# Patient Record
Sex: Male | Born: 1965 | Race: Black or African American | Hispanic: No | Marital: Married | Smoking: Current some day smoker
Health system: Southern US, Community
[De-identification: ages and names within clinical notes are randomized; demographics above are authoritative.]

## PROBLEM LIST (undated history)

## (undated) DIAGNOSIS — I10 Essential (primary) hypertension: Secondary | ICD-10-CM

## (undated) DIAGNOSIS — K219 Gastro-esophageal reflux disease without esophagitis: Secondary | ICD-10-CM

## (undated) HISTORY — DX: Gastro-esophageal reflux disease without esophagitis: K21.9

---

## 2007-10-06 ENCOUNTER — Other Ambulatory Visit: Payer: Self-pay

## 2007-10-06 ENCOUNTER — Emergency Department: Payer: Self-pay | Admitting: Emergency Medicine

## 2007-10-28 ENCOUNTER — Ambulatory Visit: Payer: Self-pay | Admitting: Specialist

## 2008-10-02 IMAGING — CT CT ABDOMEN AND PELVIS WITHOUT AND WITH CONTRAST
2 of 4 series · 14 of 32 positions shown, 19 images · non-contrast
Comparison: none

REASON FOR EXAM: hematuria flank pain polynephritis
COMMENTS:

[Series 2: soft tissue w/o · axial · non-contrast · 0.67mm/px · z∈[-733,-363]mm · 8 of 96 slices shown, 13 images]
[im 11/96  soft-tissue]
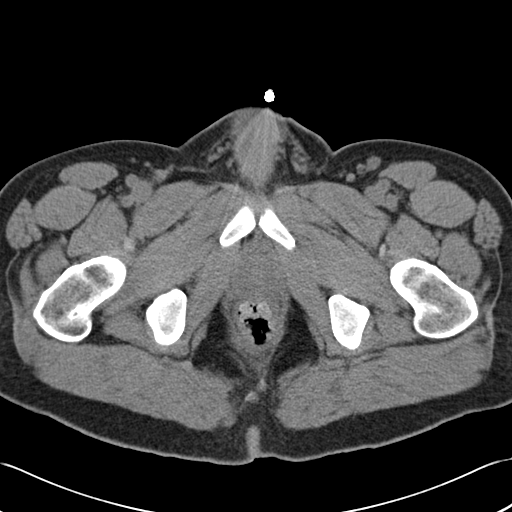
[im 11/96  bone]
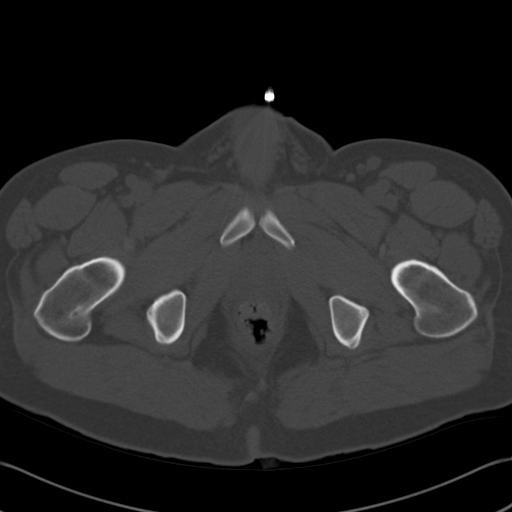
[im 22/96  soft-tissue]
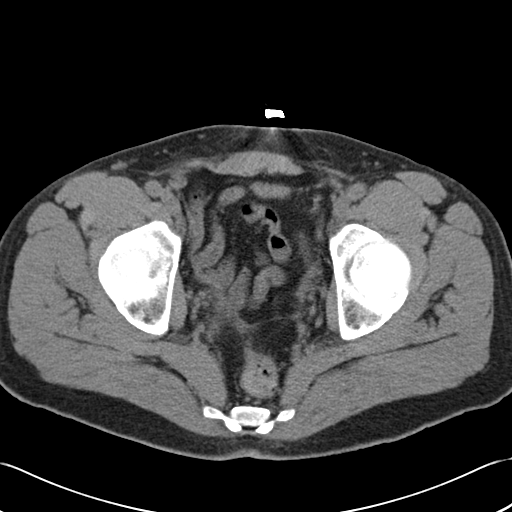
[im 32/96  soft-tissue]
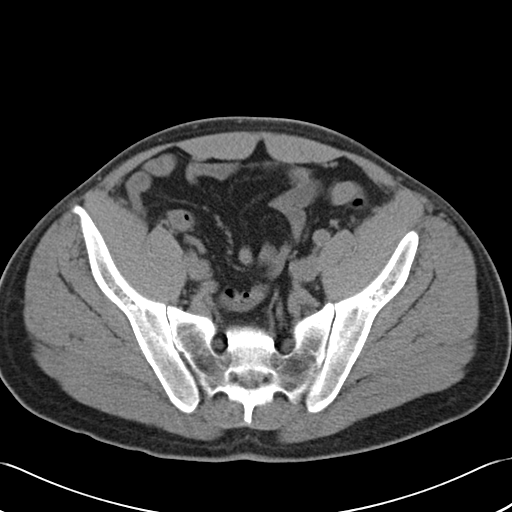
[im 43/96  soft-tissue]
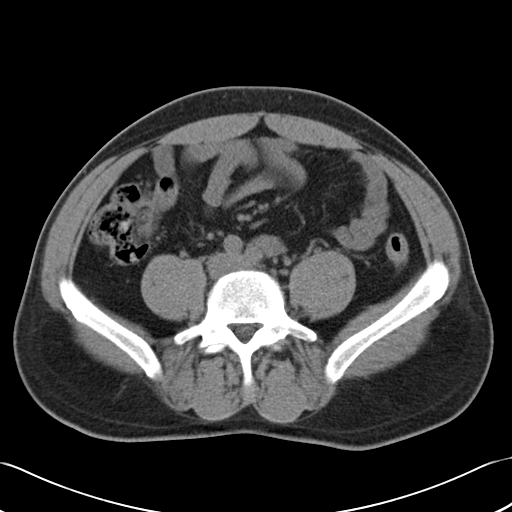
[im 53/96  soft-tissue]
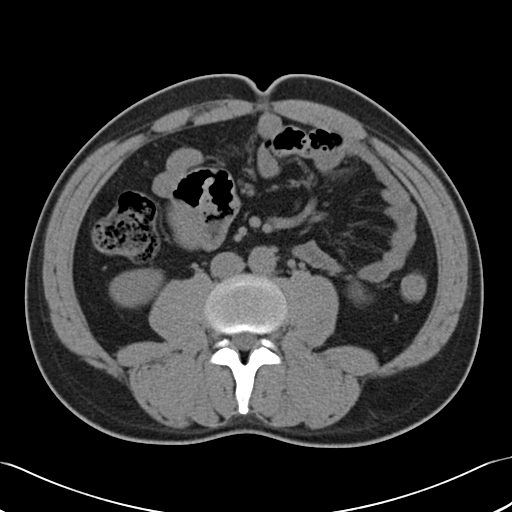
[im 53/96  lung]
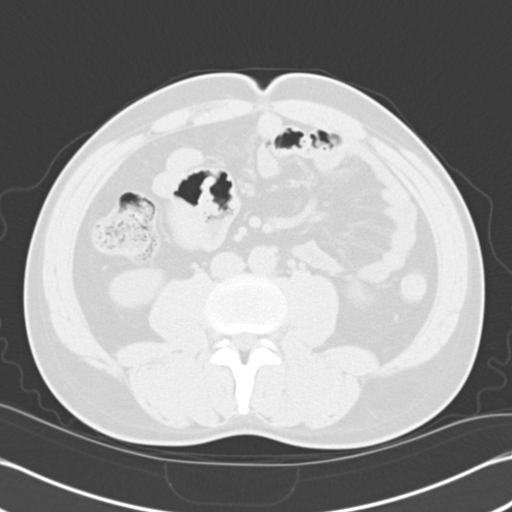
[im 64/96  soft-tissue]
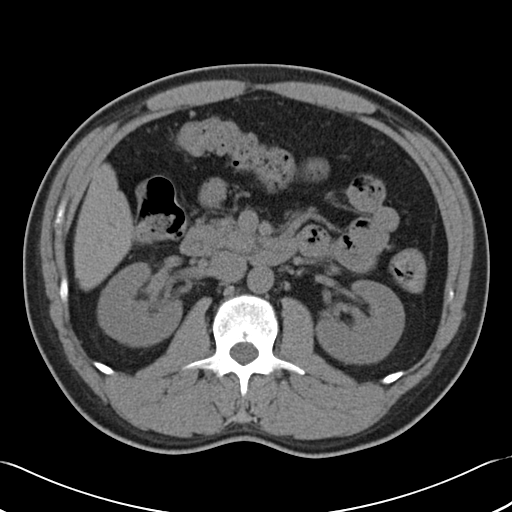
[im 64/96  lung]
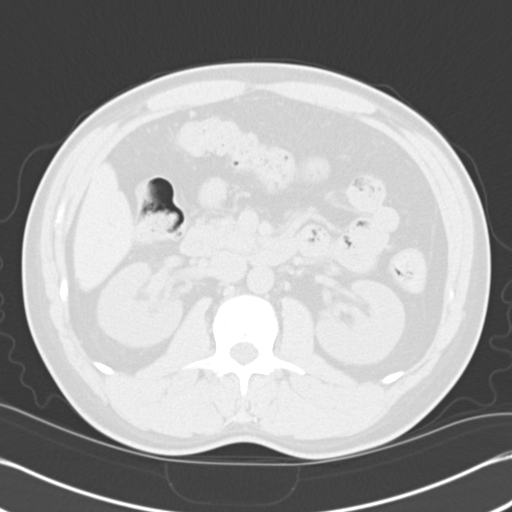
[im 74/96  soft-tissue]
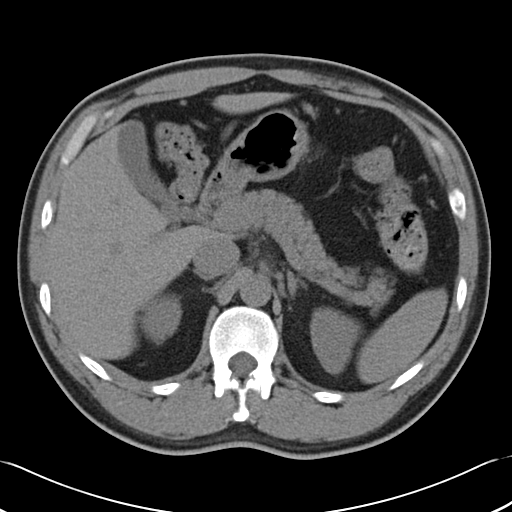
[im 74/96  lung]
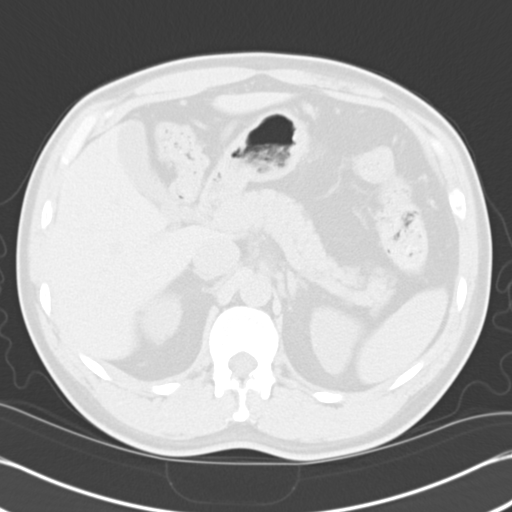
[im 85/96  soft-tissue]
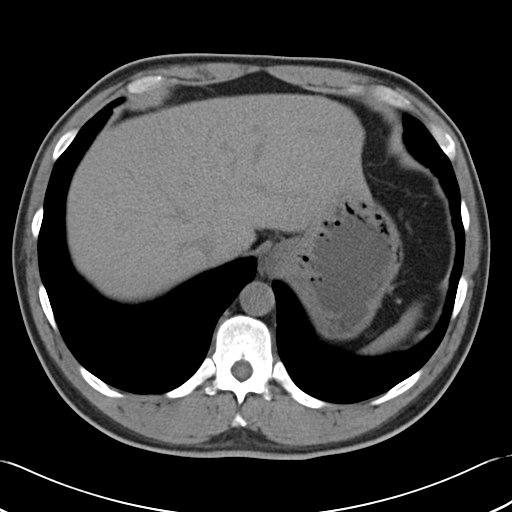
[im 85/96  lung]
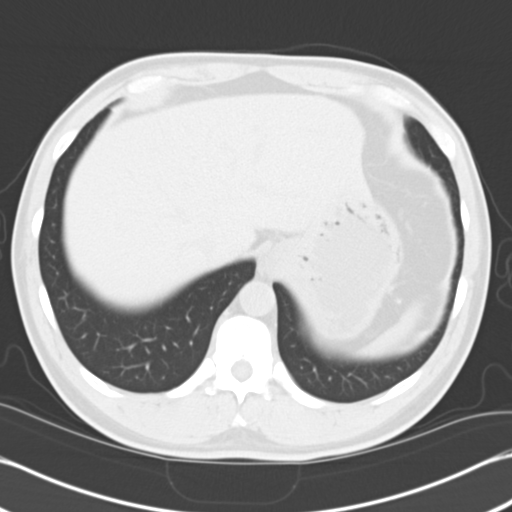

[Series 4: soft tissue with · axial · 0.67mm/px · z∈[-733,-468]mm · 6 of 96 slices shown]
[im 11/96  soft-tissue]
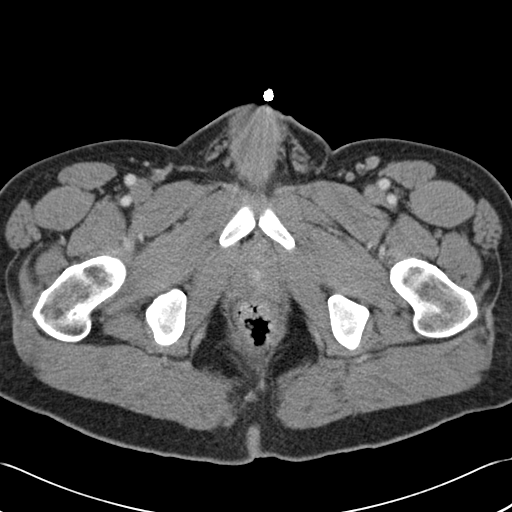
[im 22/96  soft-tissue]
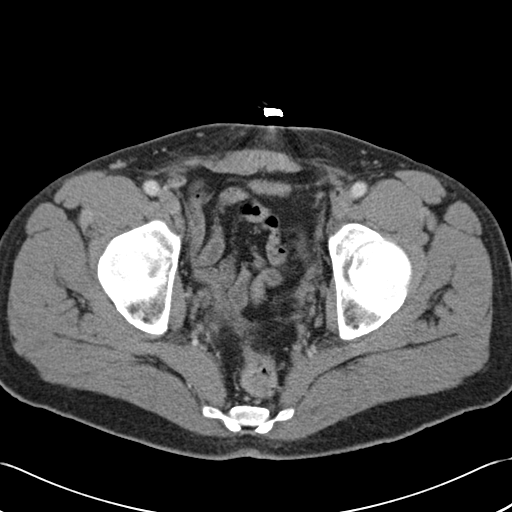
[im 32/96  soft-tissue]
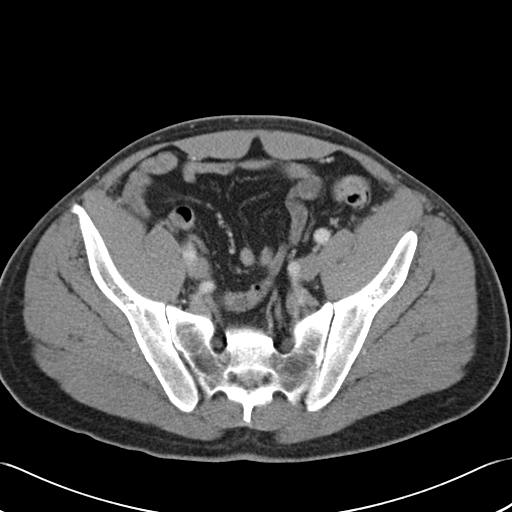
[im 43/96  soft-tissue]
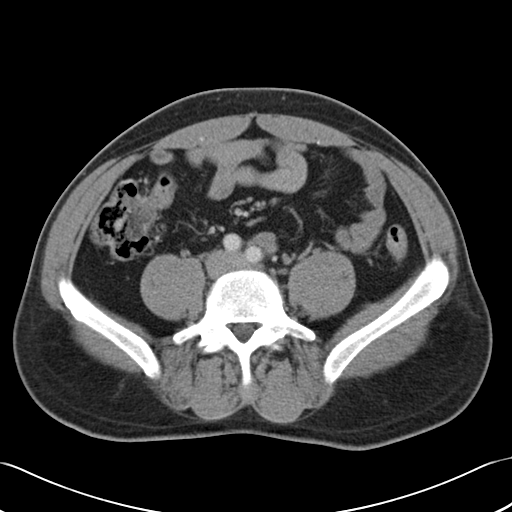
[im 53/96  soft-tissue]
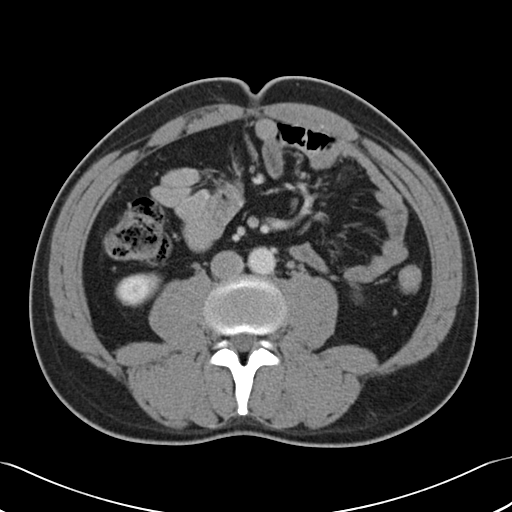
[im 64/96  soft-tissue]
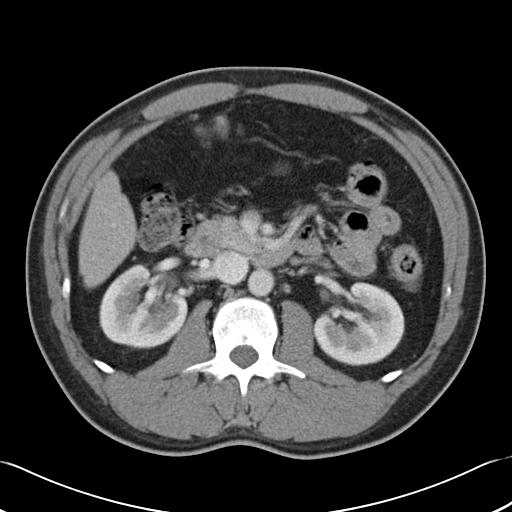

[14 of 32 positions shown; findings below may reference images not displayed]

PROCEDURE:     CT  - CT ABDOMEN / PELVIS  W/WO  - October 28, 2007  [DATE]

RESULT:     Helical pre, immediate and delayed 5 mm sections were obtained
from the lung bases through the pubic symphysis.  This study was compared to
a previous study dated 10/06/07.

Evaluation of the lung bases demonstrates no gross abnormalities.

The area of increased density within the medial aspect of the RIGHT kidney
in a parapelvic location is not clearly identified on the present study. A
small 8-9 mm low attenuating area projects within the posterolateral aspect
of the RIGHT kidney likely representing a small cyst.  No further masses,
hydronephrosis, calculi, hydroureter or ureterolithiasis is appreciated on
the RIGHT.  Evaluation of the LEFT kidney demonstrates no evidence of
hydronephrosis, masses, calculi, hydroureter, nephrolithiasis or
ureterolithiasis.

The liver, spleen, adrenals, and pancreas are unremarkable.  There is no CT
evidence of bowel obstruction, diverticulitis, colitis, appendicitis or an
abdominal aortic aneurysm.  There is no evidence of free fluid or drainable
loculated fluid collections, masses or adenopathy.
IMPRESSION: 1.     Unremarkable CT of the abdomen and pelvis.
2.     Incidental note is made of what appears to be likely a small cyst
within the posterior midpole region of the RIGHT kidney. No further
abnormalities are identified.  There does not appear to be a definite
correlation with the previous finding on CT from 10/06/07.

## 2012-06-13 ENCOUNTER — Emergency Department: Payer: Self-pay | Admitting: Emergency Medicine

## 2012-06-13 LAB — COMPREHENSIVE METABOLIC PANEL
Bilirubin,Total: 1.3 mg/dL — ABNORMAL HIGH (ref 0.2–1.0)
Calcium, Total: 9.1 mg/dL (ref 8.5–10.1)
Chloride: 108 mmol/L — ABNORMAL HIGH (ref 98–107)
Creatinine: 1.03 mg/dL (ref 0.60–1.30)
EGFR (African American): >60
EGFR (Non-African Amer.): >60
Glucose: 91 mg/dL (ref 65–99)
Osmolality: 279 (ref 275–301)
Total Protein: 8.1 g/dL (ref 6.4–8.2)

## 2012-06-13 LAB — URINALYSIS, COMPLETE
Bacteria: NONE SEEN
Bilirubin,UR: NEGATIVE
Blood: NEGATIVE
Glucose,UR: NEGATIVE mg/dL (ref 0–75)
Ketone: NEGATIVE
Leukocyte Esterase: NEGATIVE
Nitrite: NEGATIVE
Ph: 6 (ref 4.5–8.0)
Protein: NEGATIVE
RBC,UR: 1 /HPF (ref 0–5)
Specific Gravity: 1.021 (ref 1.003–1.030)
Squamous Epithelial: <1
WBC UR: <1 /HPF (ref 0–5)

## 2012-06-13 LAB — CBC WITH DIFFERENTIAL/PLATELET
Basophil #: 0 10*3/uL (ref 0.0–0.1)
Basophil %: 0.9 %
Eosinophil #: 0 10*3/uL (ref 0.0–0.7)
Eosinophil %: 1.3 %
Lymphocyte #: 1.4 10*3/uL (ref 1.0–3.6)
Lymphocyte %: 36.2 %
MCH: 28.9 pg (ref 26.0–34.0)
MCV: 86 fL (ref 80–100)
RBC: 5.5 10*6/uL (ref 4.40–5.90)
RDW: 14.1 % (ref 11.5–14.5)
WBC: 3.7 10*3/uL — ABNORMAL LOW (ref 3.8–10.6)

## 2014-07-20 ENCOUNTER — Emergency Department
Admit: 2014-07-20 | Disposition: A | Discharge status: Home / Self Care | Payer: Self-pay | Admitting: Emergency Medicine

## 2014-07-20 LAB — CBC WITH DIFFERENTIAL/PLATELET
Basophil #: 0 10*3/uL (ref 0.0–0.1)
Basophil %: 0.6 %
Eosinophil #: 0 10*3/uL (ref 0.0–0.7)
Eosinophil %: 0.4 %
HCT: 46 % (ref 40.0–52.0)
HGB: 15.1 g/dL (ref 13.0–18.0)
LYMPHS PCT: 14.8 %
Lymphocyte #: 1.1 10*3/uL (ref 1.0–3.6)
MCH: 28.9 pg (ref 26.0–34.0)
MCHC: 32.9 g/dL (ref 32.0–36.0)
MCV: 88 fL (ref 80–100)
Monocyte #: 0.4 x10 3/mm (ref 0.2–1.0)
Monocyte %: 5.6 %
NEUTROS PCT: 78.6 %
Neutrophil #: 5.9 10*3/uL (ref 1.4–6.5)
PLATELETS: 180 10*3/uL (ref 150–440)
RBC: 5.25 10*6/uL (ref 4.40–5.90)
RDW: 14.1 % (ref 11.5–14.5)
WBC: 7.5 10*3/uL (ref 3.8–10.6)

## 2014-07-20 LAB — BASIC METABOLIC PANEL
Anion Gap: 4 — ABNORMAL LOW (ref 7–16)
BUN: 19 mg/dL
CHLORIDE: 107 mmol/L
Calcium, Total: 8.9 mg/dL
Co2: 24 mmol/L
Creatinine: 1.21 mg/dL
EGFR (African American): >60
EGFR (Non-African Amer.): >60
GLUCOSE: 97 mg/dL
Potassium: 4 mmol/L
Sodium: 135 mmol/L

## 2014-07-20 LAB — TROPONIN I: Troponin-I: <0.03 ng/mL

## 2015-03-21 ENCOUNTER — Emergency Department
Admission: EM | Admit: 2015-03-21 | Discharge: 2015-03-21 | Disposition: A | Discharge status: Home / Self Care | Payer: Self-pay | Attending: Student | Admitting: Student

## 2015-03-21 ENCOUNTER — Encounter: Payer: Self-pay | Admitting: Emergency Medicine

## 2015-03-21 DIAGNOSIS — I10 Essential (primary) hypertension: Secondary | ICD-10-CM | POA: Insufficient documentation

## 2015-03-21 DIAGNOSIS — Z760 Encounter for issue of repeat prescription: Secondary | ICD-10-CM | POA: Insufficient documentation

## 2015-03-21 DIAGNOSIS — F17210 Nicotine dependence, cigarettes, uncomplicated: Secondary | ICD-10-CM | POA: Insufficient documentation

## 2015-03-21 HISTORY — DX: Essential (primary) hypertension: I10

## 2015-03-21 MED ORDER — LISINOPRIL 20 MG PO TABS: 20.0000 mg | ORAL_TABLET | Freq: Every day | ORAL | Status: DC

## 2015-03-21 NOTE — Discharge Instructions (Signed)
Hypertension Hypertension, commonly called high blood pressure, is when the force of blood pumping through your arteries is too strong. Your arteries are the blood vessels that carry blood from your heart throughout your body. A blood pressure reading consists of a higher number over a lower number, such as 110/72. The higher number (systolic) is the pressure inside your arteries when your heart pumps. The lower number (diastolic) is the pressure inside your arteries when your heart relaxes. Ideally you want your blood pressure below 120/80. Hypertension forces your heart to work harder to pump blood. Your arteries may become narrow or stiff. Having untreated or uncontrolled hypertension can cause heart attack, stroke, kidney disease, and other problems. RISK FACTORS Some risk factors for high blood pressure are controllable. Others are not.  Risk factors you cannot control include:   Race. You may be at higher risk if you are African American.  Age. Risk increases with age.  Gender. Men are at higher risk than women before age 45 years. After age 65, women are at higher risk than men. Risk factors you can control include:  Not getting enough exercise or physical activity.  Being overweight.  Getting too much fat, sugar, calories, or salt in your diet.  Drinking too much alcohol. SIGNS AND SYMPTOMS Hypertension does not usually cause signs or symptoms. Extremely high blood pressure (hypertensive crisis) may cause headache, anxiety, shortness of breath, and nosebleed. DIAGNOSIS To check if you have hypertension, your health care provider will measure your blood pressure while you are seated, with your arm held at the level of your heart. It should be measured at least twice using the same arm. Certain conditions can cause a difference in blood pressure between your right and left arms. A blood pressure reading that is higher than normal on one occasion does not mean that you need treatment. If  it is not clear whether you have high blood pressure, you may be asked to return on a different day to have your blood pressure checked again. Or, you may be asked to monitor your blood pressure at home for 1 or more weeks. TREATMENT Treating high blood pressure includes making lifestyle changes and possibly taking medicine. Living a healthy lifestyle can help lower high blood pressure. You may need to change some of your habits. Lifestyle changes may include:  Following the DASH diet. This diet is high in fruits, vegetables, and whole grains. It is low in salt, red meat, and added sugars.  Keep your sodium intake below 2,300 mg per day.  Getting at least 30-45 minutes of aerobic exercise at least 4 times per week.  Losing weight if necessary.  Not smoking.  Limiting alcoholic beverages.  Learning ways to reduce stress. Your health care provider may prescribe medicine if lifestyle changes are not enough to get your blood pressure under control, and if one of the following is true:  You are 18-59 years of age and your systolic blood pressure is above 140.  You are 60 years of age or older, and your systolic blood pressure is above 150.  Your diastolic blood pressure is above 90.  You have diabetes, and your systolic blood pressure is over 140 or your diastolic blood pressure is over 90.  You have kidney disease and your blood pressure is above 140/90.  You have heart disease and your blood pressure is above 140/90. Your personal target blood pressure may vary depending on your medical conditions, your age, and other factors. HOME CARE INSTRUCTIONS    Have your blood pressure rechecked as directed by your health care provider.   Take medicines only as directed by your health care provider. Follow the directions carefully. Blood pressure medicines must be taken as prescribed. The medicine does not work as well when you skip doses. Skipping doses also puts you at risk for  problems.  Do not smoke.   Monitor your blood pressure at home as directed by your health care provider. SEEK MEDICAL CARE IF:   You think you are having a reaction to medicines taken.  You have recurrent headaches or feel dizzy.  You have swelling in your ankles.  You have trouble with your vision. SEEK IMMEDIATE MEDICAL CARE IF:  You develop a severe headache or confusion.  You have unusual weakness, numbness, or feel faint.  You have severe chest or abdominal pain.  You vomit repeatedly.  You have trouble breathing. MAKE SURE YOU:   Understand these instructions.  Will watch your condition.  Will get help right away if you are not doing well or get worse.   This information is not intended to replace advice given to you by your health care provider. Make sure you discuss any questions you have with your health care provider.   Document Released: 03/20/2005 Document Revised: 08/04/2014 Document Reviewed: 01/10/2013 Elsevier Interactive Patient Education 2016 [employer]  

## 2015-03-21 NOTE — ED Provider Notes (Signed)
[facility] Emergency Department Provider Note ____________________________________________  Time seen: Approximately 8:11 AM  I have reviewed the triage vital signs and the nursing notes.   HISTORY  Chief Complaint Medication Refill  HPI Timothy Combs is a 49 y.o. male who presents to the emergency department for medication refill. He denies complaints today. He denies medical history other than hypertension. He has been out of the lisinopril for about 3 months. He received his last Rx here in the emergency department.   Past Medical History  Diagnosis Date  . Hypertension     There are no active problems to display for this patient.   No past surgical history on file.  Current Outpatient Rx  Name  Route  Sig  Dispense  Refill  . lisinopril (PRINIVIL,ZESTRIL) 20 MG tablet   Oral   Take 1 tablet (20 mg total) by mouth daily.   30 tablet   0     Allergies Review of patient's allergies indicates no known allergies.  No family history on file.  Social History Social History  Substance Use Topics  . Smoking status: Light Tobacco Smoker    Types: Cigars  . Smokeless tobacco: Not on file  . Alcohol Use: Not on file    Review of Systems Constitutional: No fever/chills Eyes: No visual changes. ENT: No sore throat. Cardiovascular: Denies chest pain. Respiratory: Denies shortness of breath. Gastrointestinal: No abdominal pain.  No nausea, no vomiting.  No diarrhea.  No constipation. Genitourinary: Negative for dysuria. Musculoskeletal: Negative for back pain. Skin: Negative for rash. Neurological: Negative for headaches, focal weakness or numbness.  10-point ROS otherwise negative.  ____________________________________________   PHYSICAL EXAM:  VITAL SIGNS: ED Triage Vitals  Enc Vitals Group     BP 03/21/15 0754 175/127 mmHg     Pulse Rate 03/21/15 0754 74     Resp 03/21/15 0754 20     Temp 03/21/15 0754 98.4 F (36.9 C)      Temp Source 03/21/15 0754 Oral     SpO2 03/21/15 0754 95 %     Weight 03/21/15 0754 205 lb (92.987 kg)     Height 03/21/15 0754  (1.905 m)     Head Cir --      Peak Flow --      Pain Score --      Pain Loc --      Pain Edu? --      Excl. in GC? --     Constitutional: Alert and oriented. Well appearing and in no acute distress. Eyes: Conjunctivae are normal. PERRL. EOMI. Head: Atraumatic. Nose: No congestion/rhinnorhea. Mouth/Throat: Mucous membranes are moist.  Oropharynx non-erythematous. Neck: No stridor.   Cardiovascular: Normal rate, regular rhythm. Grossly normal heart sounds.  Good peripheral circulation. Respiratory: Normal respiratory effort.  No retractions. Lungs CTAB. Gastrointestinal: Soft and nontender. No distention. No abdominal bruits. No CVA tenderness. Musculoskeletal: No lower extremity tenderness nor edema.  No joint effusions. Neurologic:  Normal speech and language. No gross focal neurologic deficits are appreciated. No gait instability. Skin:  Skin is warm, dry and intact. No rash noted. Psychiatric: Mood and affect are normal. Speech and behavior are normal.  ____________________________________________   LABS (all labs ordered are listed, but only abnormal results are displayed)  Labs Reviewed - No data to display ____________________________________________  EKG   ____________________________________________  RADIOLOGY   ____________________________________________   PROCEDURES  Procedure(s) performed: None  Critical Care performed: No  ____________________________________________   INITIAL IMPRESSION /  ASSESSMENT AND PLAN / ED COURSE  Pertinent labs & imaging results that were available during my care of the patient were reviewed by me and considered in my medical decision making (see chart for details).  Patient was given information about medication management and [facility]. He was given strict return precautions  and strongly advised to follow up. ____________________________________________   FINAL CLINICAL IMPRESSION(S) / ED DIAGNOSES  Final diagnoses:  Medication refill  Essential hypertension      Chinita PesterCari B Enna Warwick, FNP 03/21/15 [id number]  Gayla DossEryka A Gayle, MD 03/21/15 [phone]

## 2015-03-21 NOTE — ED Notes (Signed)
Pt out of lisinopril x 3 months; his friend was coming in to get a refill, so he decided to come with his friend to have his medication refilled as well. Pt alert & oriented. Denies HA, blurred vision, pain. NAD noted.

## 2015-03-21 NOTE — ED Notes (Signed)
Pt discharged home after verbalizing understanding of discharge instructions; nad noted. Pt encouraged to call [facility] immediately to schedule follow up and to see medication management for prescription help.

## 2015-03-21 NOTE — ED Notes (Signed)
Patient presents to the ED with an empty pill bottle for lisinopril 20mg .  Patient states he ran out of medication and hasn't had any for 3 months due to no insurance and no primary doctor.  This RN educated patient regarding medication management clinic.  Patient denies headache and blurry vision.  Patient is in no obvious distress at this time.

## 2017-01-29 DIAGNOSIS — I10 Essential (primary) hypertension: Secondary | ICD-10-CM | POA: Insufficient documentation

## 2017-01-29 DIAGNOSIS — R309 Painful micturition, unspecified: Secondary | ICD-10-CM | POA: Insufficient documentation

## 2017-01-29 DIAGNOSIS — I77819 Aortic ectasia, unspecified site: Secondary | ICD-10-CM | POA: Insufficient documentation

## 2017-01-29 DIAGNOSIS — F17290 Nicotine dependence, other tobacco product, uncomplicated: Secondary | ICD-10-CM | POA: Insufficient documentation

## 2017-01-29 DIAGNOSIS — N390 Urinary tract infection, site not specified: Secondary | ICD-10-CM | POA: Insufficient documentation

## 2017-01-29 LAB — URINALYSIS, COMPLETE (UACMP) WITH MICROSCOPIC
SPECIFIC GRAVITY, URINE: 1.012 (ref 1.005–1.030)
Squamous Epithelial / LPF: NONE SEEN

## 2017-01-29 LAB — COMPREHENSIVE METABOLIC PANEL
ALK PHOS: 40 U/L (ref 38–126)
ALT: 28 U/L (ref 17–63)
AST: 20 U/L (ref 15–41)
Albumin: 4.2 g/dL (ref 3.5–5.0)
Anion gap: 10 (ref 5–15)
BILIRUBIN TOTAL: 1.5 mg/dL — AB (ref 0.3–1.2)
BUN: 16 mg/dL (ref 6–20)
CALCIUM: 9.3 mg/dL (ref 8.9–10.3)
CO2: 23 mmol/L (ref 22–32)
Chloride: 101 mmol/L (ref 101–111)
Creatinine, Ser: 1.17 mg/dL (ref 0.61–1.24)
GFR calc Af Amer: >60 mL/min (ref >60)
Glucose, Bld: 95 mg/dL (ref 65–99)
POTASSIUM: 3.8 mmol/L (ref 3.5–5.1)
Sodium: 134 mmol/L — ABNORMAL LOW (ref 135–145)
TOTAL PROTEIN: 8 g/dL (ref 6.5–8.1)

## 2017-01-29 LAB — CBC
HEMATOCRIT: 46.1 % (ref 40.0–52.0)
Hemoglobin: 15.7 g/dL (ref 13.0–18.0)
MCH: 28.8 pg (ref 26.0–34.0)
MCHC: 34.1 g/dL (ref 32.0–36.0)
MCV: 84.5 fL (ref 80.0–100.0)
PLATELETS: 212 10*3/uL (ref 150–440)
RBC: 5.46 MIL/uL (ref 4.40–5.90)
RDW: 13.7 % (ref 11.5–14.5)
WBC: 6.1 10*3/uL (ref 3.8–10.6)

## 2017-01-29 LAB — LIPASE, BLOOD: LIPASE: 27 U/L (ref 11–51)

## 2017-01-29 NOTE — ED Triage Notes (Addendum)
Reports mid/suprapubic abdominal pain for 2 days.  Patient denies nausea or vomiting.  Patient with history of hypertension, but reports he does not take medications as prescribed.

## 2017-01-30 ENCOUNTER — Emergency Department: Payer: Self-pay

## 2017-01-30 ENCOUNTER — Emergency Department
Admission: EM | Admit: 2017-01-30 | Discharge: 2017-01-30 | Disposition: A | Discharge status: Home / Self Care | Payer: Self-pay | Attending: Emergency Medicine | Admitting: Emergency Medicine

## 2017-01-30 DIAGNOSIS — I77819 Aortic ectasia, unspecified site: Secondary | ICD-10-CM

## 2017-01-30 DIAGNOSIS — R309 Painful micturition, unspecified: Secondary | ICD-10-CM

## 2017-01-30 DIAGNOSIS — N390 Urinary tract infection, site not specified: Secondary | ICD-10-CM

## 2017-01-30 MED ORDER — LISINOPRIL 10 MG PO TABS
10.0000 mg | ORAL_TABLET | Freq: Once | ORAL | Status: AC
  Administered 2017-01-30: 10 mg via ORAL
  Filled 2017-01-30: qty 1

## 2017-01-30 MED ORDER — CEPHALEXIN 500 MG PO CAPS
500.0000 mg | ORAL_CAPSULE | Freq: Once | ORAL | Status: AC
Start: 2017-01-30 — End: 2017-01-30
  Administered 2017-01-30: 500 mg via ORAL
  Filled 2017-01-30: qty 1

## 2017-01-30 MED ORDER — CEPHALEXIN 500 MG PO CAPS: 500.0000 mg | ORAL_CAPSULE | Freq: Four times a day (QID) | ORAL | 0 refills | Status: DC

## 2017-01-30 NOTE — Discharge Instructions (Signed)

## 2017-01-30 NOTE — ED Notes (Signed)
Pt back from ultrasound.

## 2017-01-30 NOTE — ED Provider Notes (Signed)
[facility] Emergency Department Provider Note ____________________________________________   First MD Initiated Contact with Patient 01/30/17 [phone]     (approximate)  I have reviewed the triage vital signs and the nursing notes.   HISTORY  Chief Complaint Abdominal Pain    HPI Timothy Combs is a 51 y.o. male history of hypertension  The patient presents today, reports he has been experiencing pain with urination for the last 2-3 days.  No nausea vomiting.  He denies abdominal pain to me, reported to triage that he was having mid to upper abdominal pain, but he reports the only time he is experiencing pain is when he goes to urinate and the pain is over his bladder area.  Cloudy he thinks he may have a "urinary infection".  No fevers or chills.  Sexually active with only 1 partner, has not seen any discharge or drainage from the tip of the penis.  No back pain.  No numbness or tingling.  No weakness.  No chest pain or trouble breathing.  Reports he supposed to take lisinopril 10 mg, but has not taken a few days as he is not very good about taking his medications.   Past Medical History:  Diagnosis Date  . Hypertension     There are no active problems to display for this patient.   No past surgical history on file.  Prior to Admission medications   Medication Sig Start Date End Date Taking? Authorizing Provider  cephALEXin (KEFLEX) 500 MG capsule Take 1 capsule (500 mg total) by mouth 4 (four) times daily. 01/30/17   Sharyn Creamer, MD  lisinopril (PRINIVIL,ZESTRIL) 20 MG tablet Take 1 tablet (20 mg total) by mouth daily. 03/21/15 03/20/16  Chinita Pester, FNP    Allergies Patient has no known allergies.  No family history on file.  Social History Social History  Substance Use Topics  . Smoking status: Light Tobacco Smoker    Types: Cigars  . Smokeless tobacco: Not on file  . Alcohol use Not on file    Review of  Systems Constitutional: No fever/chills Eyes: No visual changes. ENT: No sore throat. Cardiovascular: Denies chest pain. Respiratory: Denies shortness of breath. Gastrointestinal: No abdominal pain.  No nausea, no vomiting.  No diarrhea.  No constipation. Genitourinary: See HPI Musculoskeletal: Negative for back pain. Skin: Negative for rash. Neurological: Negative for headaches, focal weakness or numbness.    ____________________________________________   PHYSICAL EXAM:  VITAL SIGNS: ED Triage Vitals  Enc Vitals Group     BP 01/29/17 2238 (!) 176/136     Pulse Rate 01/29/17 2238 89     Resp 01/29/17 2238 20     Temp 01/29/17 2238 98.9 F (37.2 C)     Temp Source 01/29/17 2238 Oral     SpO2 01/30/17 0119 97 %     Weight 01/29/17 2236 235 lb (106.6 kg)     Height 01/29/17 2236 6\' 3"  (1.905 m)     Head Circumference --      Peak Flow --      Pain Score 01/29/17 2235 10     Pain Loc --      Pain Edu? --      Excl. in GC? --     Constitutional: Alert and oriented. Well appearing and in no acute distress. Eyes: Conjunctivae are normal. Head: Atraumatic. Nose: No congestion/rhinnorhea. Mouth/Throat: Mucous membranes are moist. Neck: No stridor.   Cardiovascular: Normal rate, regular rhythm. Grossly normal heart sounds.  Good peripheral circulation. Respiratory: Normal respiratory effort.  No retractions. Lungs CTAB. Gastrointestinal: Soft and nontender. No distention.  No abdominal pain in any quadrant to palpation.  No rebound or guarding.  No pain at McBurney's point.  Negative Murphy. Musculoskeletal: No lower extremity tenderness nor edema. Neurologic:  Normal speech and language. No gross focal neurologic deficits are appreciated.  Skin:  Skin is warm, dry and intact. No rash noted. Psychiatric: Mood and affect are normal. Speech and behavior are normal.  ____________________________________________   LABS (all labs ordered are listed, but only abnormal results  are displayed)  Labs Reviewed  COMPREHENSIVE METABOLIC PANEL - Abnormal; Notable for the following:       Result Value   Sodium 134 (*)    Total Bilirubin 1.5 (*)    All other components within normal limits  URINALYSIS, COMPLETE (UACMP) WITH MICROSCOPIC - Abnormal; Notable for the following:    APPearance CLOUDY (*)    Glucose, UA   (*)    Value: TEST NOT REPORTED DUE TO COLOR INTERFERENCE OF URINE PIGMENT   Hgb urine dipstick   (*)    Value: TEST NOT REPORTED DUE TO COLOR INTERFERENCE OF URINE PIGMENT   Bilirubin Urine   (*)    Value: TEST NOT REPORTED DUE TO COLOR INTERFERENCE OF URINE PIGMENT   Ketones, ur   (*)    Value: TEST NOT REPORTED DUE TO COLOR INTERFERENCE OF URINE PIGMENT   Protein, ur   (*)    Value: TEST NOT REPORTED DUE TO COLOR INTERFERENCE OF URINE PIGMENT   Nitrite   (*)    Value: TEST NOT REPORTED DUE TO COLOR INTERFERENCE OF URINE PIGMENT   Leukocytes, UA   (*)    Value: TEST NOT REPORTED DUE TO COLOR INTERFERENCE OF URINE PIGMENT   Bacteria, UA RARE (*)    All other components within normal limits  LIPASE, BLOOD  CBC   ____________________________________________  EKG   ____________________________________________  RADIOLOGY  [place] Retroperitoneal Comp  Result Date: 01/30/2017 CLINICAL DATA:  Painful urination.  Suprapubic pain. EXAM: ULTRASOUND RETROPERITONEAL COMPLETE TECHNIQUE: Ultrasound examination of the abdominal aorta was performed to evaluate for abdominal aortic aneurysm. The common iliac arteries, IVC, and kidneys were also evaluated. COMPARISON:  Remote CT 10/28/2007 FINDINGS: Abdominal Aorta No aneurysm identified. Maximum AP Diameter:  2.8 cm. Maximum TRV Diameter: 2.5 cm. Right Common Iliac Artery No aneurysm identified. Left Common Iliac Artery No aneurysm identified. IVC No abnormality visualized. Right Kidney Length: 12.6 cm. Borderline increased echogenicity and renal parenchymal thinning. 1 cm cyst in the mid kidney. No solid mass or  hydronephrosis visualized. Left Kidney Length: 13.3 cm. Borderline increased renal echogenicity. No mass or hydronephrosis visualized. Bladder: Both ureteral jets are visualized. No urinary bladder wall thickening. IMPRESSION: 1. No explanation for painful urination. 2. Borderline increased renal echogenicity and mild renal parenchymal thinning of the right kidney, possible chronic medical renal disease. 3. Ectatic abdominal aorta at risk for aneurysm development, maximal dimension 2.8 cm. Recommend followup by ultrasound in 5 years. This recommendation follows ACR consensus guidelines: White Paper of the ACR Incidental Findings Committee II on Vascular Findings. J Am Coll Radiol 2013; 16:109-604. 4. Small right renal cyst. Electronically Signed   By: Rubye Oaks M.D.   On: 01/30/2017 03:38    Ultrasound result reviewed, ectatic aorta, borderline paenchymal thinning, ____________________________________________   PROCEDURES  Procedure(s) performed: None  Procedures  Critical Care performed: No  ____________________________________________   INITIAL IMPRESSION / ASSESSMENT AND PLAN /  ED COURSE  Pertinent labs & imaging results that were available during my care of the patient were reviewed by me and considered in my medical decision making (see chart for details).  Patient presents for dysuria.  Urine is noted to be cloudy with red and white cells and rare bacteria.  Suspect possibly the patient does have a urinary tract infection given the associated dysuria, and he may have hemorrhagic cystitis given the notable number of red cells and interference.  He denies any abdominal pain, and does not appear to be at high risk for aortic etiology and I will screen with an ultrasound.  Also given the amount of red cells in the urine obtain ultrasound to evaluate for possible kidney stone.  No peritonitis, no infectious symptoms, afebrile with normal white count and I do not suspect other acute  intra-abdominal infection such as appendicitis, cholecystitis, diverticulitis at this time.  If ultrasounds renal and aortic are normal, anticipate discharge likely with Keflex for treatment of probable UTI possible hemorrhagic cystitis.  Clinical Course as of Jan 31 344  Tue Jan 30, 2017  [id number] Patient resting comfortably, reports no complaint.  Did discuss his high blood pressure, he reports he has medication he just does not take it, encouraged him to assure be compliant with his medication therapy.  He is agreeable to trying harder at taking his blood pressure medicine and understands that he needs follow-up for this as well.  [MQ]    Clinical Course User Index [MQ] Sharyn CreamerQuale, Mark, MD   Ultrasound imaging shows no acute.  Did discuss findings including ectatic aorta and slightly abnormal renal appearance with the patient, he is agreeable to outpatient follow-up and I advised follow-up with both nephrology and vascular surgery.  Also strongly recommended follow-up with his primary care doctor regarding treatment of his hypertension and follow-up on his treatment regarding today's urinary tract infection.  Patient agreeable.  Return precautions and treatment recommendations and follow-up discussed with the patient who is agreeable with the plan.  ____________________________________________   FINAL CLINICAL IMPRESSION(S) / ED DIAGNOSES  Final diagnoses:  Ectatic aorta (HCC)  Urinary tract infection, acute      NEW MEDICATIONS STARTED DURING THIS VISIT:  New Prescriptions   CEPHALEXIN (KEFLEX) 500 MG CAPSULE    Take 1 capsule (500 mg total) by mouth 4 (four) times daily.     Note:  This document was prepared using Dragon voice recognition software and may include unintentional dictation errors.     Sharyn CreamerQuale, Mark, MD 01/30/17 [phone]

## 2019-07-04 ENCOUNTER — Telehealth: Payer: Self-pay | Admitting: *Deleted

## 2019-07-04 NOTE — Telephone Encounter (Signed)
Attempted to contact regarding lung screening referral. There is no answer or voicemail option available

## 2019-07-07 NOTE — Telephone Encounter (Signed)
Attempted to contact, however there is no answer or voicemail option

## 2019-09-16 ENCOUNTER — Other Ambulatory Visit: Payer: Self-pay

## 2019-09-16 ENCOUNTER — Ambulatory Visit: Payer: Self-pay

## 2019-09-16 LAB — POCT URINE DRUG SCREEN
POC Amphetamine UR: NOT DETECTED
POC Cocaine UR: NOT DETECTED
POC Methamphetamine UR: NOT DETECTED
POC Opiate Ur: NOT DETECTED
POC PHENCYCLIDINE UR: NOT DETECTED
URINE TEMPERATURE: 94 Degrees F (ref 90.0–100.0)

## 2019-10-30 ENCOUNTER — Encounter: Payer: Self-pay | Admitting: *Deleted

## 2020-03-29 ENCOUNTER — Ambulatory Visit
Admission: EM | Admit: 2020-03-29 | Discharge: 2020-03-29 | Disposition: A | Discharge status: Home / Self Care | Payer: BC Managed Care – PPO | Attending: Family Medicine | Admitting: Family Medicine

## 2020-03-29 ENCOUNTER — Other Ambulatory Visit: Payer: Self-pay

## 2020-03-29 DIAGNOSIS — U071 COVID-19: Secondary | ICD-10-CM | POA: Diagnosis not present

## 2020-03-29 LAB — RESP PANEL BY RT-PCR (FLU A&B, COVID) ARPGX2
Influenza A by PCR: NEGATIVE
Influenza B by PCR: NEGATIVE
SARS Coronavirus 2 by RT PCR: POSITIVE — AB

## 2020-03-29 MED ORDER — KETOROLAC TROMETHAMINE 10 MG PO TABS: 10.0000 mg | ORAL_TABLET | Freq: Four times a day (QID) | ORAL | 0 refills | Status: DC | PRN

## 2020-03-29 NOTE — Discharge Instructions (Signed)
Rest.  Medication as needed for pain.  I have reached out to the infusion clinic.  Take care  Dr. Adriana Simas

## 2020-03-29 NOTE — ED Provider Notes (Signed)
[facility]    CSN: [id number] Arrival date & time: 03/29/20  0801      History   Chief Complaint Chief Complaint  Patient presents with  . Generalized Body Aches   HPI  54 year old male presents with generalized body aches and cough.  2-day history of body aches and mild occasional cough.  He denies any other respiratory symptoms.  He states that he has been exposed to COVID-19.  His girlfriend, 41-week-old son, and girlfriends child have tested positive for COVID-19.  He was tested recently but he was tested before he was symptomatic.  His test was negative.  He is in need of testing today.  No other reported symptoms.  No other complaints or concerns at this time.  Past Medical History:  Diagnosis Date  . Hypertension    Home Medications    Prior to Admission medications   Medication Sig Start Date End Date Taking? Authorizing Provider  lisinopril (PRINIVIL,ZESTRIL) 20 MG tablet Take 1 tablet (20 mg total) by mouth daily. 03/21/15 03/20/16 Yes Triplett, Cari B, FNP  ketorolac (TORADOL) 10 MG tablet Take 1 tablet (10 mg total) by mouth every 6 (six) hours as needed for moderate pain or severe pain. 03/29/20   Tommie Sams, DO    Family History Family History  Problem Relation Age of Onset  . Heart failure Mother     Social History Social History   Tobacco Use  . Smoking status: Light Tobacco Smoker    Types: Cigars  . Smokeless tobacco: Never Used  Vaping Use  . Vaping Use: Never used  Substance Use Topics  . Alcohol use: Not Currently  . Drug use: Not Currently     Allergies   Patient has no known allergies.   Review of Systems Review of Systems  Constitutional: Negative for fever.  Respiratory: Positive for cough.   Musculoskeletal:       Body aches.   Physical Exam Triage Vital Signs ED Triage Vitals  Enc Vitals Group     BP 03/29/20 0818 (!) 126/100     Pulse Rate 03/29/20 0818 (!) 127     Resp 03/29/20 0818 18     Temp  03/29/20 0818 98.9 F (37.2 C)     Temp Source 03/29/20 0818 Oral     SpO2 03/29/20 0818 96 %     Weight 03/29/20 0814 235 lb (106.6 kg)     Height 03/29/20 0814 6\' 3"  (1.905 m)     Head Circumference --      Peak Flow --      Pain Score 03/29/20 0814 7     Pain Loc --      Pain Edu? --      Excl. in [place]? --    Updated Vital Signs BP (!) 126/100 (BP Location: Right Arm)   Pulse (!) 127   Temp 98.9 F (37.2 C) (Oral)   Resp 18   Ht 6\' 3"  (1.905 m)   Wt 106.6 kg   SpO2 96%   BMI 29.37 kg/m   Visual Acuity Right Eye Distance:   Left Eye Distance:   Bilateral Distance:    Right Eye Near:   Left Eye Near:    Bilateral Near:     Physical Exam Vitals and nursing note reviewed.  Constitutional:      General: He is not in acute distress.    Appearance: Normal appearance. He is not ill-appearing.  HENT:     Head: Normocephalic and  atraumatic.  Eyes:     General:        Right eye: No discharge.        Left eye: No discharge.     Conjunctiva/sclera: Conjunctivae normal.  Cardiovascular:     Rate and Rhythm: Regular rhythm. Tachycardia present.  Pulmonary:     Effort: Pulmonary effort is normal.     Breath sounds: Normal breath sounds. No wheezing, rhonchi or rales.  Neurological:     Mental Status: He is alert.  Psychiatric:        Mood and Affect: Mood normal.        Behavior: Behavior normal.    UC Treatments / Results  Labs (all labs ordered are listed, but only abnormal results are displayed) Labs Reviewed  RESP PANEL BY RT-PCR (FLU A&B, COVID) ARPGX2 - Abnormal; Notable for the following components:      Result Value   SARS Coronavirus 2 by RT PCR POSITIVE (*)    All other components within normal limits    EKG   Radiology No results found.  Procedures Procedures (including critical care time)  Medications Ordered in UC Medications - No data to display  Initial Impression / Assessment and Plan / UC Course  I have reviewed the triage vital signs  and the nursing notes.  Pertinent labs & imaging results that were available during my care of the patient were reviewed by me and considered in my medical decision making (see chart for details).    54 year old male presents with body aches and cough.  Patient Covid positive.  Toradol for body aches.  Information sent to the infusion clinic.  Supportive care.  Work note given.  Final Clinical Impressions(s) / UC Diagnoses   Final diagnoses:  COVID     Discharge Instructions     Rest.  Medication as needed for pain.  I have reached out to the infusion clinic.  Take care  Dr. Adriana Simas     ED Prescriptions    Medication Sig Dispense Auth. Provider   ketorolac (TORADOL) 10 MG tablet Take 1 tablet (10 mg total) by mouth every 6 (six) hours as needed for moderate pain or severe pain. 20 tablet Tommie Sams, DO     PDMP not reviewed this encounter.   Tommie Sams, [place] 03/29/20 [phone]

## 2020-03-29 NOTE — ED Triage Notes (Signed)
Patient complains of body aches with cough x 2 days.

## 2020-03-30 ENCOUNTER — Telehealth: Payer: Self-pay | Admitting: Nurse Practitioner

## 2020-03-30 NOTE — Telephone Encounter (Signed)
Called to Discuss with patient about Covid symptoms and the use of the monoclonal antibody infusion for those with mild to moderate Covid symptoms and at a high risk of hospitalization.     Pt appears to qualify for this infusion due to co-morbid conditions and/or a member of an at-risk group in accordance with the FDA Emergency Use Authorization. BMI 30, htn   Unable to reach pt. No voicemail set-up  Symptom onset: 12/25 or 12/26 Vaccinated: NO Qualified for Infusion: Yes   Willette Alma, NP [facility]  [phone]

## 2021-08-10 ENCOUNTER — Ambulatory Visit
Admission: EM | Admit: 2021-08-10 | Discharge: 2021-08-10 | Disposition: A | Discharge status: Home / Self Care | Payer: BC Managed Care – PPO

## 2021-08-10 DIAGNOSIS — M5431 Sciatica, right side: Secondary | ICD-10-CM

## 2021-08-10 MED ORDER — KETOROLAC TROMETHAMINE 60 MG/2ML IM SOLN
60.0000 mg | Freq: Once | INTRAMUSCULAR | Status: AC
  Administered 2021-08-10: 60 mg via INTRAMUSCULAR

## 2021-08-10 MED ORDER — PREDNISONE 10 MG (21) PO TBPK: ORAL_TABLET | ORAL | 0 refills | Status: DC

## 2021-08-10 NOTE — ED Provider Notes (Signed)
?[facility] ? ? ? ?CSN: [id number] ?Arrival date & time: 08/10/21  1741 ? ? ?  ? ?History   ?Chief Complaint ?Chief Complaint  ?Patient presents with  ? Leg Pain  ? ? ?HPI ?Timothy Combs is a 56 y.o. male.  ? ?HPI ? ?56 year old male here for evaluation of right leg pain. ? ?Patient reports that he has been experiencing pain in his right leg that starts in his buttock and travels down the back of his leg to the calf for last 3 weeks.  This is not associated with low back pain, heavy lifting, falls or injuries.  He also denies any numbness or tingling in his foot.  He states it is worse when he has been sitting for long periods of time and better when he is up and walking. ? ?Past Medical History:  ?Diagnosis Date  ? Hypertension   ? ? ?There are no problems to display for this patient. ? ? ?History reviewed. No pertinent surgical history. ? ? ? ? ?Home Medications   ? ?Prior to Admission medications   ?Medication Sig Start Date End Date Taking? Authorizing Provider  ?amLODipine (NORVASC) 5 MG tablet Take 5 mg by mouth daily. 05/24/21  Yes [provider]  ?lisinopril (PRINIVIL,ZESTRIL) 20 MG tablet Take 1 tablet (20 mg total) by mouth daily. 03/21/15 08/10/21 Yes Triplett, Dessa Phi, FNP  ?predniSONE (STERAPRED UNI-PAK 21 TAB) 10 MG (21) TBPK tablet Take 6 tablets on day 1, 5 tablets day 2, 4 tablets day 3, 3 tablets day 4, 2 tablets day 5, 1 tablet day 6 08/10/21  Yes Margarette Canada, NP  ?ketorolac (TORADOL) 10 MG tablet Take 1 tablet (10 mg total) by mouth every 6 (six) hours as needed for moderate pain or severe pain. 03/29/20   Coral Spikes, DO  ?lisinopril-hydrochlorothiazide (ZESTORETIC) 20-25 MG tablet Take 1 tablet by mouth daily. 05/24/21   [provider]  ? ? ?Family History ?Family History  ?Problem Relation Age of Onset  ? Heart failure Mother   ? ? ?Social History ?Social History  ? ?Tobacco Use  ? Smoking status: Light Smoker  ?  Types: Cigars  ? Smokeless tobacco: Never   ?Vaping Use  ? Vaping Use: Never used  ?Substance Use Topics  ? Alcohol use: Not Currently  ? Drug use: Not Currently  ? ? ? ?Allergies   ?Patient has no known allergies. ? ? ?Review of Systems ?Review of Systems  ?Musculoskeletal:  Positive for myalgias. Negative for back pain and joint swelling.  ?Neurological:  Negative for weakness and numbness.  ?Hematological: Negative.   ?Psychiatric/Behavioral: Negative.    ? ? ?Physical Exam ?Triage Vital Signs ?ED Triage Vitals  ?Enc Vitals Group  ?   BP 08/10/21 1800 140/73  ?   Pulse Rate 08/10/21 1800 73  ?   Resp 08/10/21 1800 18  ?   Temp 08/10/21 1800 98.1 ?F (36.7 ?C)  ?   Temp Source 08/10/21 1800 Oral  ?   SpO2 08/10/21 1800 96 %  ?   Weight 08/10/21 1758 230 lb (104.3 kg)  ?   Height 08/10/21 1758 6\' 3"  (1.905 m)  ?   Head Circumference --   ?   Peak Flow --   ?   Pain Score 08/10/21 1755 9  ?   Pain Loc --   ?   Pain Edu? --   ?   Excl. in [place]? --   ? ?No data found. ? ?  Updated Vital Signs ?BP 140/73 (BP Location: Left Arm)   Pulse 73   Temp 98.1 ?F (36.7 ?C) (Oral)   Resp 18   Ht 6\' 3"  (1.905 m)   Wt 230 lb (104.3 kg)   SpO2 96%   BMI 28.75 kg/m?  ? ?Visual Acuity ?Right Eye Distance:   ?Left Eye Distance:   ?Bilateral Distance:   ? ?Right Eye Near:   ?Left Eye Near:    ?Bilateral Near:    ? ?Physical Exam ?Vitals and nursing note reviewed.  ?Constitutional:   ?   Appearance: Normal appearance. He is not ill-appearing.  ?HENT:  ?   Head: Normocephalic and atraumatic.  ?Cardiovascular:  ?   Rate and Rhythm: Normal rate and regular rhythm.  ?   Pulses: Normal pulses.  ?   Heart sounds: Normal heart sounds. No murmur heard. ?  No friction rub. No gallop.  ?Pulmonary:  ?   Effort: Pulmonary effort is normal.  ?   Breath sounds: Normal breath sounds. No wheezing, rhonchi or rales.  ?Musculoskeletal:     ?   General: Tenderness present. No swelling or deformity. Normal range of motion.  ?Skin: ?   General: Skin is warm and dry.  ?   Capillary Refill: Capillary  refill takes less than 2 seconds.  ?   Findings: No erythema or rash.  ?Neurological:  ?   General: No focal deficit present.  ?   Mental Status: He is alert and oriented to person, place, and time.  ?Psychiatric:     ?   Mood and Affect: Mood normal.     ?   Behavior: Behavior normal.     ?   Thought Content: Thought content normal.     ?   Judgment: Judgment normal.  ? ? ? ?UC Treatments / Results  ?Labs ?(all labs ordered are listed, but only abnormal results are displayed) ?Labs Reviewed - No data to display ? ?EKG ? ? ?Radiology ?No results found. ? ?Procedures ?Procedures (including critical care time) ? ?Medications Ordered in UC ?Medications  ?ketorolac (TORADOL) injection 60 mg (has no administration in time range)  ? ? ?Initial Impression / Assessment and Plan / UC Course  ?I have reviewed the triage vital signs and the nursing notes. ? ?Pertinent labs & imaging results that were available during my care of the patient were reviewed by me and considered in my medical decision making (see chart for details). ? ?Very pleasant, nontoxic-appearing 56 year old male here for evaluation of pain in his right leg that starts in his right buttock and travels down the back of his leg to his calf.  This is not associate with numbness or tingling in the foot.  He also denies any injury.  He states it is worse when he sits for long periods of time and improves when he is up walking.  On exam patient's right leg is in normal anatomical alignment.  He has no tenderness with palpation of the lumbar spine, either midline or in the paraspinous region.  No tenderness with palpation of the path of the sciatic nerve through the buttock.  The pain does develop in the posterior hamstring at the level of the greater trochanter.  Following the path the sciatic nerve patient does not have any further tenderness when palpating down the right leg.  He also has a negative seated right and left straight leg raise.  Bilateral lower  extremity strength is 5/5.  Suspect patient has some sciatic  inflammation.  I will give him a shot of Toradol in clinic and treat him with a 6-day course of prednisone.  I will also give him home physical therapy exercises to perform.  There is no appreciable spasm in the hamstring complex so I do not feel that he needs a muscle relaxer at this time. ? ? ?Final Clinical Impressions(s) / UC Diagnoses  ? ?Final diagnoses:  ?Sciatica of right side  ? ? ? ?Discharge Instructions   ? ?  ?Take the prednisone according to the package instructions.  You will take it each morning with breakfast.  Make sure that you are always taking it with food. ? ?Follow the rehabilitation exercises in your discharge paperwork to help you with your sciatic pain. ? ?Return for reevaluation for new or worsening symptoms, or see your primary care provider.  ? ? ? ? ?ED Prescriptions   ? ? Medication Sig Dispense Auth. Provider  ? predniSONE (STERAPRED UNI-PAK 21 TAB) 10 MG (21) TBPK tablet Take 6 tablets on day 1, 5 tablets day 2, 4 tablets day 3, 3 tablets day 4, 2 tablets day 5, 1 tablet day 6 21 tablet Margarette Canada, NP  ? ?  ? ?PDMP not reviewed this encounter. ?  ?Margarette Canada, NP ?08/10/21 1824 ? ?

## 2021-08-10 NOTE — ED Triage Notes (Signed)
Patient is here for ""right let pain, aching, tingling". "I have it about 3 wks". Pain starts above buttocks, and "shoots down back side". No injury known.  ?

## 2021-08-10 NOTE — Discharge Instructions (Signed)
Take the prednisone according to the package instructions.  You will take it each morning with breakfast.  Make sure that you are always taking it with food.  Follow the rehabilitation exercises in your discharge paperwork to help you with your sciatic pain.  Return for reevaluation for new or worsening symptoms, or see your primary care provider.  

## 2023-07-30 ENCOUNTER — Ambulatory Visit: Admitting: Family Medicine

## 2023-07-30 ENCOUNTER — Ambulatory Visit
Admission: RE | Admit: 2023-07-30 | Discharge: 2023-07-30 | Disposition: A | Discharge status: Home / Self Care | Source: Ambulatory Visit | Attending: Family Medicine | Admitting: Family Medicine

## 2023-07-30 ENCOUNTER — Ambulatory Visit
Admission: RE | Admit: 2023-07-30 | Discharge: 2023-07-30 | Disposition: A | Discharge status: Home / Self Care | Attending: Family Medicine | Admitting: Family Medicine

## 2023-07-30 ENCOUNTER — Encounter: Payer: Self-pay | Admitting: Family Medicine

## 2023-07-30 VITALS — BP 145/92 | HR 79 | Resp 16 | Ht 75.0 in | Wt 243.6 lb

## 2023-07-30 DIAGNOSIS — Z720 Tobacco use: Secondary | ICD-10-CM

## 2023-07-30 DIAGNOSIS — M7989 Other specified soft tissue disorders: Secondary | ICD-10-CM | POA: Diagnosis not present

## 2023-07-30 DIAGNOSIS — M19071 Primary osteoarthritis, right ankle and foot: Secondary | ICD-10-CM | POA: Diagnosis not present

## 2023-07-30 DIAGNOSIS — F17210 Nicotine dependence, cigarettes, uncomplicated: Secondary | ICD-10-CM | POA: Diagnosis not present

## 2023-07-30 DIAGNOSIS — E782 Mixed hyperlipidemia: Secondary | ICD-10-CM | POA: Diagnosis not present

## 2023-07-30 DIAGNOSIS — Z136 Encounter for screening for cardiovascular disorders: Secondary | ICD-10-CM

## 2023-07-30 DIAGNOSIS — M2011 Hallux valgus (acquired), right foot: Secondary | ICD-10-CM | POA: Diagnosis not present

## 2023-07-30 DIAGNOSIS — Z1211 Encounter for screening for malignant neoplasm of colon: Secondary | ICD-10-CM

## 2023-07-30 DIAGNOSIS — Z13220 Encounter for screening for lipoid disorders: Secondary | ICD-10-CM

## 2023-07-30 DIAGNOSIS — R2241 Localized swelling, mass and lump, right lower limb: Secondary | ICD-10-CM

## 2023-07-30 DIAGNOSIS — Z760 Encounter for issue of repeat prescription: Secondary | ICD-10-CM

## 2023-07-30 DIAGNOSIS — Z1159 Encounter for screening for other viral diseases: Secondary | ICD-10-CM

## 2023-07-30 DIAGNOSIS — I10 Essential (primary) hypertension: Secondary | ICD-10-CM

## 2023-07-30 DIAGNOSIS — Z716 Tobacco abuse counseling: Secondary | ICD-10-CM

## 2023-07-30 DIAGNOSIS — Z125 Encounter for screening for malignant neoplasm of prostate: Secondary | ICD-10-CM

## 2023-07-30 DIAGNOSIS — Z131 Encounter for screening for diabetes mellitus: Secondary | ICD-10-CM

## 2023-07-30 MED ORDER — ATORVASTATIN CALCIUM 40 MG PO TABS: 40.0000 mg | ORAL_TABLET | Freq: Every day | ORAL | 0 refills | Status: DC

## 2023-07-30 MED ORDER — AMLODIPINE BESYLATE 5 MG PO TABS: 5.0000 mg | ORAL_TABLET | Freq: Every day | ORAL | 0 refills | Status: DC

## 2023-07-30 MED ORDER — LISINOPRIL-HYDROCHLOROTHIAZIDE 20-25 MG PO TABS: 1.0000 | ORAL_TABLET | Freq: Every day | ORAL | 0 refills | Status: DC

## 2023-07-30 NOTE — Progress Notes (Signed)
 New Patient Office Visit  Subjective    Patient ID: Timothy Combs, male    DOB: 11/15/1965  Age: 58 y.o. MRN: [id number]  CC:  Chief Complaint  Patient presents with   Establish Care    Right foot injury last Monday swelling and spot on foot. Doing Ice. Dropped frozen bottle of water on it.     HPI AMAURIS GERENA  With  PMH of  hypertension, HLD presents to establish care. Last visit with PCP 3 years ago. Ran out of refills and his PCP was unable to refill without appointment which was scheduled in June, so he decided to come here. He is also concerned about right foot swelling. Reports week ago a frozen 16 ounce water bottle fell on his right foot, since then he developed some swelling which got better slightly.  Denies pain or trouble  with walking. Tender on palpation. Has been icing and foot elevation. 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 Wears steel toes.   HTN:   Ran out of meds.  Home BP 150/90. Uncontrolled, He is Asymptomatic, drinking beet juice to help lower BP.  HLD:  Not taking Statin.  States he was told he has Aneursym, no imaging or referral was done when he first established with previous PCP.  [facility]. Last seen 3 years ago.  Social:  Smokes cigarret black mild 6 kids, and 3 grand kids.  Lives with wife and has 2 kids with her 36 and 52  year old.    Outpatient Encounter Medications as of 07/30/2023  Medication Sig   [DISCONTINUED] amLODipine (NORVASC) 5 MG tablet Take 5 mg by mouth daily.   [DISCONTINUED] atorvastatin (LIPITOR) 40 MG tablet Take 40 mg by mouth daily.   [DISCONTINUED] lisinopril -hydrochlorothiazide (ZESTORETIC) 20-25 MG tablet Take 1 tablet by mouth daily.   amLODipine (NORVASC) 5 MG tablet Take 1 tablet (5 mg total) by mouth daily.   atorvastatin (LIPITOR) 40 MG tablet Take 1 tablet (40 mg total) by mouth daily.   lisinopril -hydrochlorothiazide (ZESTORETIC) 20-25 MG tablet Take 1 tablet by mouth daily.    [DISCONTINUED] ketorolac  (TORADOL ) 10 MG tablet Take 1 tablet (10 mg total) by mouth every 6 (six) hours as needed for moderate pain or severe pain. (Patient not taking: Reported on 07/30/2023)   [DISCONTINUED] lisinopril  (PRINIVIL ,ZESTRIL ) 20 MG tablet Take 1 tablet (20 mg total) by mouth daily.   [DISCONTINUED] predniSONE  (STERAPRED UNI-PAK 21 TAB) 10 MG (21) TBPK tablet Take 6 tablets on day 1, 5 tablets day 2, 4 tablets day 3, 3 tablets day 4, 2 tablets day 5, 1 tablet day 6 (Patient not taking: Reported on 07/30/2023)   No facility-administered encounter medications on file as of 07/30/2023.    Past Medical History:  Diagnosis Date   Hypertension     History reviewed. No pertinent surgical history.  Family History  Problem Relation Age of Onset   Heart failure Mother     Social History   Socioeconomic History   Marital status: Single    Spouse name: Not on file   Number of children: Not on file   Years of education: Not on file   Highest education level: Not on file  Occupational History   Not on file  Tobacco Use   Smoking status: Light Smoker    Types: Cigars    Passive exposure: Never   Smokeless tobacco: Never  Vaping Use   Vaping status: Never Used  Substance and Sexual Activity  Alcohol use: Not Currently   Drug use: Not Currently   Sexual activity: Not on file  Other Topics Concern   Not on file  Social History Narrative   Not on file   Social Drivers of Health   Financial Resource Strain: Not on file  Food Insecurity: Not on file  Transportation Needs: Not on file  Physical Activity: Not on file  Stress: Not on file  Social Connections: Not on file  Intimate Partner Violence: Not on file    Review of Systems  All other systems reviewed and are negative.       Objective    BP (!) 145/92   Pulse 79   Resp 16   Ht 6\' 3"  (1.905 m)   Wt 243 lb 9.6 oz (110.5 kg)   SpO2 97%   BMI 30.45 kg/m   Physical Exam Vitals and nursing note  reviewed.  Constitutional:      Appearance: Normal appearance.  HENT:     Head: Normocephalic.     Right Ear: External ear normal.     Left Ear: External ear normal.  Eyes:     Conjunctiva/sclera: Conjunctivae normal.  Cardiovascular:     Rate and Rhythm: Normal rate.  Pulmonary:     Effort: Pulmonary effort is normal. No respiratory distress.  Abdominal:     Palpations: Abdomen is soft.  Musculoskeletal:        General: Swelling and tenderness present. Normal range of motion.     Comments: Localized swelling over the right foot. Tenderness elicited with mild palpation. PROM some restriction noted with eversion.   Skin:    General: Skin is warm.  Neurological:     Mental Status: He is alert and oriented to person, place, and time.  Psychiatric:        Mood and Affect: Mood normal.        Assessment & Plan:   Problem List Items Addressed This Visit       Cardiovascular and Mediastinum   Essential hypertension - Primary   Uncontrolled due to non compliance to treatment plan, Restart home medications. RTC with BP log, Recommend DASH diet and exercise.      Relevant Medications   amLODipine (NORVASC) 5 MG tablet   atorvastatin (LIPITOR) 40 MG tablet   lisinopril -hydrochlorothiazide (ZESTORETIC) 20-25 MG tablet   Other Relevant Orders   Comprehensive metabolic panel with GFR     Other   Mixed hyperlipidemia   Recheck lipid panel, Restart Atorvastatin 40 mg every day       Relevant Medications   amLODipine (NORVASC) 5 MG tablet   atorvastatin (LIPITOR) 40 MG tablet   lisinopril -hydrochlorothiazide (ZESTORETIC) 20-25 MG tablet   Other Relevant Orders   Lipid panel   Light cigarette smoker   Encouraged to quit smoking, pt not ready.      Relevant Orders   CBC with Differential   Localized swelling of right foot   Unlikely fracture, Order Right foot xray, Apply a compressive ACE bandage. Rest and elevate the affected painful area.  Apply cold compresses  intermittently as needed.  As pain recedes, begin normal activities slowly as tolerated.  Call if symptoms persist.       Relevant Orders   CBC with Differential   DG Foot Complete Right   Other Visit Diagnoses       Encounter for lipid screening for cardiovascular disease         Diabetes mellitus screening  Relevant Orders   Hemoglobin A1c     Screening for colon cancer       Relevant Orders   Ambulatory referral to Gastroenterology     Screening for prostate cancer       Relevant Orders   PSA     Encounter for hepatitis C screening test for low risk patient       Relevant Orders   Hepatitis C antibody     Medication refill       Relevant Medications   amLODipine (NORVASC) 5 MG tablet   atorvastatin (LIPITOR) 40 MG tablet   lisinopril -hydrochlorothiazide (ZESTORETIC) 20-25 MG tablet     Tobacco abuse counseling           Return in about 2 weeks (around 08/13/2023) for Annual.   Ladena Picking, MD

## 2023-07-30 NOTE — Assessment & Plan Note (Signed)
 Encouraged to quit smoking, pt not ready.

## 2023-07-30 NOTE — Assessment & Plan Note (Signed)
 Unlikely fracture, Order Right foot xray, Apply a compressive ACE bandage. Rest and elevate the affected painful area.  Apply cold compresses intermittently as needed.  As pain recedes, begin normal activities slowly as tolerated.  Call if symptoms persist.

## 2023-07-30 NOTE — Assessment & Plan Note (Signed)
 Uncontrolled due to non compliance to treatment plan, Restart home medications. RTC with BP log, Recommend DASH diet and exercise.

## 2023-07-30 NOTE — Assessment & Plan Note (Signed)
 Recheck lipid panel, Restart Atorvastatin 40 mg every day

## 2023-07-31 ENCOUNTER — Other Ambulatory Visit: Payer: Self-pay | Admitting: Family Medicine

## 2023-07-31 ENCOUNTER — Encounter: Payer: Self-pay | Admitting: Family Medicine

## 2023-07-31 DIAGNOSIS — I714 Abdominal aortic aneurysm, without rupture, unspecified: Secondary | ICD-10-CM

## 2023-07-31 LAB — HEPATITIS C ANTIBODY: Hep C Virus Ab: NONREACTIVE

## 2023-07-31 NOTE — Progress Notes (Signed)
 Ordering a follow up US  per reccomendation, based on US  findings in 2018.

## 2023-07-31 NOTE — Telephone Encounter (Signed)
 Pt xray read is pending. Discussed with pt about possible fracture seen on xray. Recommend  cam boot, off loading, follow up in 6 weeks.

## 2023-08-03 ENCOUNTER — Encounter: Payer: Self-pay | Admitting: Family Medicine

## 2023-08-06 ENCOUNTER — Other Ambulatory Visit

## 2023-08-13 ENCOUNTER — Ambulatory Visit: Admitting: Family Medicine

## 2023-08-13 ENCOUNTER — Ambulatory Visit
Admission: RE | Admit: 2023-08-13 | Discharge: 2023-08-13 | Disposition: A | Discharge status: Home / Self Care | Source: Ambulatory Visit | Attending: Family Medicine | Admitting: Family Medicine

## 2023-08-13 VITALS — BP 150/90 | HR 65 | Ht 75.0 in | Wt 244.1 lb

## 2023-08-13 DIAGNOSIS — I714 Abdominal aortic aneurysm, without rupture, unspecified: Secondary | ICD-10-CM | POA: Insufficient documentation

## 2023-08-13 DIAGNOSIS — E782 Mixed hyperlipidemia: Secondary | ICD-10-CM | POA: Diagnosis not present

## 2023-08-13 DIAGNOSIS — R2241 Localized swelling, mass and lump, right lower limb: Secondary | ICD-10-CM | POA: Diagnosis not present

## 2023-08-13 DIAGNOSIS — Z125 Encounter for screening for malignant neoplasm of prostate: Secondary | ICD-10-CM | POA: Diagnosis not present

## 2023-08-13 DIAGNOSIS — Z0389 Encounter for observation for other suspected diseases and conditions ruled out: Secondary | ICD-10-CM | POA: Diagnosis not present

## 2023-08-13 DIAGNOSIS — Z0000 Encounter for general adult medical examination without abnormal findings: Secondary | ICD-10-CM | POA: Diagnosis not present

## 2023-08-13 DIAGNOSIS — I10 Essential (primary) hypertension: Secondary | ICD-10-CM | POA: Diagnosis not present

## 2023-08-13 DIAGNOSIS — Z131 Encounter for screening for diabetes mellitus: Secondary | ICD-10-CM | POA: Diagnosis not present

## 2023-08-13 DIAGNOSIS — F17210 Nicotine dependence, cigarettes, uncomplicated: Secondary | ICD-10-CM | POA: Diagnosis not present

## 2023-08-13 MED ORDER — AMLODIPINE BESYLATE 10 MG PO TABS
10.0000 mg | ORAL_TABLET | Freq: Every day | ORAL | 1 refills | Status: DC
Start: 2023-08-13 — End: 2024-02-08

## 2023-08-13 NOTE — Progress Notes (Signed)
 Complete physical exam  Patient: Timothy Combs   DOB: [birth year]   58 y.o. Male  MRN: [id number]  Subjective:     Chief Complaint  Patient presents with   Annual Exam    ROMIE Combs is a 58 y.o. male who presents today for a complete physical exam. He reports consuming a general diet. The patient has a physically strenuous job, but has no regular exercise apart from work.  He generally feels well. He reports sleeping well. He does not have additional problems to discuss today.    Most recent fall risk assessment:    08/13/2023    8:53 AM  Fall Risk   Falls in the past year? 0  Number falls in past yr: 0  Injury with Fall? 0  Risk for fall due to : No Fall Risks  Follow up Falls evaluation completed     Most recent depression screenings:    08/13/2023    8:53 AM 07/30/2023   11:15 AM  PHQ 2/9 Scores  PHQ - 2 Score 0 0  PHQ- 9 Score 0     Vision:Not within last year     Patient Care Team: Patient, No Pcp Per as PCP - General (General Practice)   Outpatient Medications Prior to Visit  Medication Sig   atorvastatin  (LIPITOR) 40 MG tablet Take 1 tablet (40 mg total) by mouth daily.   lisinopril -hydrochlorothiazide  (ZESTORETIC ) 20-25 MG tablet Take 1 tablet by mouth daily.   [DISCONTINUED] amLODipine  (NORVASC ) 5 MG tablet Take 1 tablet (5 mg total) by mouth daily.   No facility-administered medications prior to visit.    Review of Systems  All other systems reviewed and are negative.         Objective:     BP (!) 146/98   Pulse 65   Ht 6\' 3"  (1.905 m)   Wt 244 lb 2 oz (110.7 kg)   SpO2 98%   BMI 30.51 kg/m    Physical Exam Vitals and nursing note reviewed.  Constitutional:      Appearance: Normal appearance.  HENT:     Head: Normocephalic.     Right Ear: External ear normal.     Left Ear: External ear normal.  Eyes:     Conjunctiva/sclera: Conjunctivae normal.  Cardiovascular:     Rate and Rhythm: Normal rate.  Pulmonary:      Effort: Pulmonary effort is normal. No respiratory distress.  Abdominal:     Palpations: Abdomen is soft.  Musculoskeletal:        General: Normal range of motion.  Skin:    General: Skin is warm.  Neurological:     Mental Status: He is alert and oriented to person, place, and time.  Psychiatric:        Mood and Affect: Mood normal.      No results found for any visits on 08/13/23.     Assessment & Plan:    Routine Health Maintenance and Physical Exam   There is no immunization history on file for this patient.  Health Maintenance  Topic Date Due   HIV Screening  Never done   DTaP/Tdap/Td (1 - Tdap) Never done   Pneumococcal Vaccine 32-63 Years old (1 of 2 - PCV) Never done   Colonoscopy  Never done   Zoster Vaccines- Shingrix (1 of 2) Never done   COVID-19 Vaccine (1 - 2024-25 season) Never done   INFLUENZA VACCINE  11/02/2023   Hepatitis C Screening  Completed  HPV VACCINES  Aged Out   Meningococcal B Vaccine  Aged Out    Discussed health benefits of physical activity, and encouraged him to engage in regular exercise appropriate for his age and condition.  Problem List Items Addressed This Visit       Cardiovascular and Mediastinum   Essential hypertension   Other Visit Diagnoses       General medical exam    -  Primary       No follow-ups on file.     Timothy K Walburga Hudman, MD

## 2023-08-13 NOTE — Assessment & Plan Note (Signed)
 No results found for: "LDLCALC" The ASCVD Risk score (Arnett DK, et al., 2019) failed to calculate for the following reasons:   Cannot find a previous HDL lab   Cannot find a previous total cholesterol lab  Labs pending., continue Lipitor 40 mg every day

## 2023-08-13 NOTE — Assessment & Plan Note (Signed)
 Not interested to try NRT.

## 2023-08-13 NOTE — Assessment & Plan Note (Signed)
 Resolved, xray negative for fracture

## 2023-08-13 NOTE — Patient Instructions (Signed)
 Your BP goal is 130/80.  Today your BP was 150/90.   We will increase Amlodipine  from 5 mg to 10 mg  every day.  Please continue other BP medication same dose.   If you have any questions call our office.    Also cut down on salt intake, eat healthy diet and exercise.

## 2023-08-13 NOTE — Assessment & Plan Note (Signed)
 BP goal is 130/80.  Today BP was 150/90.   We will increase Amlodipine  from 5 mg to 10 mg  every day. Please continue other BP medication same dose.   If you have any questions call our office.    Also cut down on salt intake, eat healthy diet and exercise.

## 2023-08-14 ENCOUNTER — Ambulatory Visit: Payer: Self-pay | Admitting: Family Medicine

## 2023-08-14 LAB — PSA: Prostate Specific Ag, Serum: 1.1 ng/mL (ref 0.0–4.0)

## 2023-08-14 LAB — COMPREHENSIVE METABOLIC PANEL WITH GFR
ALT: 23 IU/L (ref 0–44)
AST: 14 IU/L (ref 0–40)
Albumin: 4.2 g/dL (ref 3.8–4.9)
Alkaline Phosphatase: 46 IU/L (ref 44–121)
BUN/Creatinine Ratio: 17 (ref 9–20)
BUN: 18 mg/dL (ref 6–24)
Bilirubin Total: 0.6 mg/dL (ref 0.0–1.2)
CO2: 22 mmol/L (ref 20–29)
Calcium: 9.6 mg/dL (ref 8.7–10.2)
Chloride: 104 mmol/L (ref 96–106)
Creatinine, Ser: 1.08 mg/dL (ref 0.76–1.27)
Globulin, Total: 2.7 g/dL (ref 1.5–4.5)
Glucose: 96 mg/dL (ref 70–99)
Potassium: 4.5 mmol/L (ref 3.5–5.2)
Sodium: 139 mmol/L (ref 134–144)
Total Protein: 6.9 g/dL (ref 6.0–8.5)
eGFR: 81 mL/min/{1.73_m2} (ref >59)

## 2023-08-14 LAB — LIPID PANEL
Chol/HDL Ratio: 3.6 ratio (ref 0.0–5.0)
Cholesterol, Total: 129 mg/dL (ref 100–199)
HDL: 36 mg/dL — ABNORMAL LOW (ref >39)
LDL Chol Calc (NIH): 71 mg/dL (ref 0–99)
Triglycerides: 124 mg/dL (ref 0–149)
VLDL Cholesterol Cal: 22 mg/dL (ref 5–40)

## 2023-08-14 LAB — CBC WITH DIFFERENTIAL/PLATELET
Basophils Absolute: 0 10*3/uL (ref 0.0–0.2)
Basos: 1 %
EOS (ABSOLUTE): 0.1 10*3/uL (ref 0.0–0.4)
Eos: 3 %
Hematocrit: 47.3 % (ref 37.5–51.0)
Hemoglobin: 15.4 g/dL (ref 13.0–17.7)
Immature Grans (Abs): 0 10*3/uL (ref 0.0–0.1)
Immature Granulocytes: 0 %
Lymphocytes Absolute: 2 10*3/uL (ref 0.7–3.1)
Lymphs: 44 %
MCH: 28.2 pg (ref 26.6–33.0)
MCHC: 32.6 g/dL (ref 31.5–35.7)
MCV: 87 fL (ref 79–97)
Monocytes Absolute: 0.4 10*3/uL (ref 0.1–0.9)
Monocytes: 10 %
Neutrophils Absolute: 1.8 10*3/uL (ref 1.4–7.0)
Neutrophils: 42 %
Platelets: 233 10*3/uL (ref 150–450)
RBC: 5.46 x10E6/uL (ref 4.14–5.80)
RDW: 13.8 % (ref 11.6–15.4)
WBC: 4.4 10*3/uL (ref 3.4–10.8)

## 2023-08-14 LAB — HEMOGLOBIN A1C
Est. average glucose Bld gHb Est-mCnc: 120 mg/dL
Hgb A1c MFr Bld: 5.8 % — ABNORMAL HIGH (ref 4.8–5.6)

## 2023-08-21 ENCOUNTER — Encounter: Payer: Self-pay | Admitting: *Deleted

## 2023-09-10 ENCOUNTER — Ambulatory Visit: Admitting: Family Medicine

## 2023-09-10 ENCOUNTER — Encounter: Payer: Self-pay | Admitting: Family Medicine

## 2023-09-10 VITALS — BP 128/76 | HR 82 | Ht 75.0 in | Wt 238.0 lb

## 2023-09-10 DIAGNOSIS — Z720 Tobacco use: Secondary | ICD-10-CM | POA: Diagnosis not present

## 2023-09-10 DIAGNOSIS — F17210 Nicotine dependence, cigarettes, uncomplicated: Secondary | ICD-10-CM

## 2023-09-10 DIAGNOSIS — I10 Essential (primary) hypertension: Secondary | ICD-10-CM | POA: Diagnosis not present

## 2023-09-10 DIAGNOSIS — Z760 Encounter for issue of repeat prescription: Secondary | ICD-10-CM

## 2023-09-10 DIAGNOSIS — Z23 Encounter for immunization: Secondary | ICD-10-CM | POA: Diagnosis not present

## 2023-09-10 DIAGNOSIS — E782 Mixed hyperlipidemia: Secondary | ICD-10-CM

## 2023-09-10 MED ORDER — LISINOPRIL-HYDROCHLOROTHIAZIDE 20-25 MG PO TABS: 1.0000 | ORAL_TABLET | Freq: Every day | ORAL | 1 refills | Status: DC

## 2023-09-10 MED ORDER — ATORVASTATIN CALCIUM 40 MG PO TABS: 40.0000 mg | ORAL_TABLET | Freq: Every day | ORAL | 1 refills | Status: DC

## 2023-09-10 NOTE — Assessment & Plan Note (Signed)
 Blood pressure within goal below 130/80.  Patient denies side effects, recommend BP log next visit, DASH diet and exercise, continue current medications.

## 2023-09-10 NOTE — Progress Notes (Signed)
   Established Patient Office Visit  Subjective   Patient ID: Timothy Combs, male    DOB: Jun 02, 1965  Age: 58 y.o. MRN: [id number]  Chief Complaint  Patient presents with   Hypertension     Assessment & Plan:   Problem List Items Addressed This Visit       Cardiovascular and Mediastinum   Essential hypertension - Primary   Relevant Medications   atorvastatin  (LIPITOR) 40 MG tablet   lisinopril -hydrochlorothiazide  (ZESTORETIC ) 20-25 MG tablet   Other Visit Diagnoses       Medication refill       Relevant Medications   atorvastatin  (LIPITOR) 40 MG tablet   lisinopril -hydrochlorothiazide  (ZESTORETIC ) 20-25 MG tablet     Immunization due       Relevant Orders   Varicella-zoster vaccine IM (Completed)       Return in about 6 months (around 03/11/2024) for HTN with PCP, chronic follow up with PCP, 3 month nurse visit for Shingrix #2.    Subjective:   Patient is a 58 y.o. male who presents for follow-up of hypertension. His high blood pressure was first noted at last visit here several weeks ago. Home blood pressure readings: did not bring log. Salt intake and diet: trying to eat less salty food. Associated signs and symptoms: none. Patient denies: chest pain, dyspnea, headache, and palpitations. Use of agents associated with hypertension: none. Medication compliance: taking as prescribed.  The following portions of the patient's history were reviewed and updated as appropriate: allergies, current medications, past family history, past medical history, past social history, past surgical history, and problem list.             Review of Systems  All other systems reviewed and are negative.     Objective:     BP 128/76   Pulse 82   Ht 6\' 3"  (1.905 m)   Wt 238 lb (108 kg)   SpO2 98%   BMI 29.75 kg/m    Physical Exam Vitals and nursing note reviewed.  Constitutional:      Appearance: Normal appearance.  HENT:     Head: Normocephalic.     Right Ear:  External ear normal.     Left Ear: External ear normal.  Eyes:     Conjunctiva/sclera: Conjunctivae normal.  Cardiovascular:     Rate and Rhythm: Normal rate.  Pulmonary:     Effort: Pulmonary effort is normal. No respiratory distress.  Abdominal:     Palpations: Abdomen is soft.  Musculoskeletal:        General: Normal range of motion.  Skin:    General: Skin is warm.  Neurological:     Mental Status: He is alert and oriented to person, place, and time.  Psychiatric:        Mood and Affect: Mood normal.      No results found for any visits on 09/10/23.    The ASCVD Risk score (Arnett DK, et al., 2019) failed to calculate for the following reasons:   The valid total cholesterol range is 130 to 320 mg/dL      Oneta Bilberry, MD

## 2023-09-10 NOTE — Assessment & Plan Note (Signed)
 Lab Results  Component Value Date   LDLCALC 71 08/13/2023  Well-controlled with statin, continue current medications.

## 2023-09-10 NOTE — Assessment & Plan Note (Signed)
 Patient was counseled on smoking cessation, patient notes he is a mild smoker.  Refusing NRT.

## 2023-09-10 NOTE — Patient Instructions (Signed)
 Zoster Vaccine Injection What is this medication? ZOSTER VACCINE (ZOS ter vak SEEN) reduces the risk of herpes zoster (shingles). It does not treat shingles. It is still possible to get shingles after receiving the vaccine, but the symptoms may be less severe or not last as long. It works by helping your immune system learn how to fight off a future infection. This medicine may be used for other purposes; ask your health care provider or pharmacist if you have questions. COMMON BRAND NAME(S): [facility] What should I tell my care team before I take this medication? They need to know if you have any of these conditions: Cancer Immune system problems An unusual or allergic reaction to Zoster vaccine, other medications, foods, dyes, or preservatives Pregnant or trying to get pregnant Breastfeeding How should I use this medication? This vaccine is injected into a muscle. It is given by your care team. This vaccine requires 2 doses to get the full benefit. Set a reminder for when your next dose is due. A copy of Vaccine Information Statements will be given before each vaccination. Be sure to read this information carefully each time. This sheet may change often. Talk to your care team about the use of this vaccine in children. This vaccine is not approved for use in children. Overdosage: If you think you have taken too much of this medicine contact a poison control center or emergency room at once. NOTE: This medicine is only for you. Do not share this medicine with others. What if I miss a dose? Keep appointments for follow-up (booster) doses. It is important not to miss your dose. Call your care team if you are unable to keep an appointment. What may interact with this medication? Medications that suppress your immune system Medications to treat cancer Steroid medications, such as prednisone or cortisone This list may not describe all possible interactions. Give your health care provider a list  of all the medicines, herbs, non-prescription drugs, or dietary supplements you use. Also tell them if you smoke, drink alcohol, or use illegal drugs. Some items may interact with your medicine. What should I watch for while using this medication? Visit your care team regularly. This vaccine, like all vaccines, may not fully protect everyone. What side effects may I notice from receiving this medication? Side effects that you should report to your care team as soon as possible: Allergic reactions--skin rash, itching, hives, swelling of the face, lips, tongue, or throat Side effects that usually do not require medical attention (report these to your care team if they continue or are bothersome): Chills Fatigue Feeling faint or lightheaded Fever Headache Muscle pain Pain, redness, or irritation at injection site This list may not describe all possible side effects. Call your doctor for medical advice about side effects. You may report side effects to FDA at 1-800-FDA-1088. Where should I keep my medication? This vaccine is only given by your care team. It will not be stored at home. NOTE: This sheet is a summary. It may not cover all possible information. If you have questions about this medicine, talk to your doctor, pharmacist, or health care provider.  2024 Elsevier/Gold Standard (2021-09-08 00:00:00)

## 2023-09-13 ENCOUNTER — Telehealth: Payer: Self-pay | Admitting: Family Medicine

## 2023-09-13 NOTE — Telephone Encounter (Signed)
 [facility] called and spoke to [facility] about the refill(s) BP medications requested. Advised zestoretic  and atorvastatin  was sent on 09/10/23. She says both were too early to refill because the patient picked up a 90 day supply on 07/30/23. They have the prescriptions on file. I attempted to call the wife back, phone disconnected, called and left VM to return the call to the office. Patient called and advised of the above, he verbalized understanding.    Copied from CRM [id number]. Topic: Clinical - Medication Question >> Sep 13, 2023  4:25 PM Turkey B wrote: Reason for CRM: P'ts wife called in about bp med for pt. She states pt had an appt on June 9 and was supposed to be given med. She isnt sure if its still the amlodipine  or if any changes were made to this. They went to the pharmacy and nothing was there for pt. She states it was supposed to be for a 90 day supply

## 2023-09-14 ENCOUNTER — Telehealth: Payer: Self-pay

## 2023-09-14 NOTE — Telephone Encounter (Signed)
 Continue Amlodipine  10 mg every day and zestotretic 20 - 25 mg every day. Refills in place.

## 2023-09-14 NOTE — Telephone Encounter (Signed)
 Spoke with Deanna and informed her .  JM

## 2023-09-14 NOTE — Telephone Encounter (Signed)
 Copied from CRM [id number]. Topic: General - Other >> Sep 13, 2023  5:06 PM DeAngela L wrote: Reason for CRM: returning a call from the office about medication question and had a vm mail from [employer] back number Deanna [phone]

## 2023-10-26 ENCOUNTER — Other Ambulatory Visit: Payer: Self-pay | Admitting: Family Medicine

## 2023-10-26 DIAGNOSIS — Z760 Encounter for issue of repeat prescription: Secondary | ICD-10-CM

## 2023-10-26 NOTE — Telephone Encounter (Signed)
 Requested Prescriptions  Refused Prescriptions Disp Refills   lisinopril -hydrochlorothiazide  (ZESTORETIC ) 20-25 MG tablet [Pharmacy Med Name: LISINOPRIL -HCTZ 20-25 MG TAB] 90 tablet 1    Sig: TAKE 1 TABLET BY MOUTH EVERY DAY     Cardiovascular:  ACEI + Diuretic Combos Passed - 10/26/2023  5:15 PM      Passed - Na in normal range and within 180 days    Sodium  Date Value Ref Range Status  08/13/2023 139 134 - 144 mmol/L Final  07/20/2014 135 mmol/L Final    Comment:    135-145 NOTE: New Reference Range  06/09/14          Passed - K in normal range and within 180 days    Potassium  Date Value Ref Range Status  08/13/2023 4.5 3.5 - 5.2 mmol/L Final  07/20/2014 4.0 mmol/L Final    Comment:    3.5-5.1 NOTE: New Reference Range  06/09/14          Passed - Cr in normal range and within 180 days    Creatinine  Date Value Ref Range Status  07/20/2014 1.21 mg/dL Final    Comment:    9.38-8.75 NOTE: New Reference Range  06/09/14    Creatinine, Ser  Date Value Ref Range Status  08/13/2023 1.08 0.76 - 1.27 mg/dL Final         Passed - eGFR is 30 or above and within 180 days    EGFR (African American)  Date Value Ref Range Status  07/20/2014 >60  Final   GFR calc Af Amer  Date Value Ref Range Status  01/29/2017 >60 >60 mL/min Final    Comment:    (NOTE) The eGFR has been calculated using the CKD EPI equation. This calculation has not been validated in all clinical situations. eGFR's persistently <60 mL/min signify possible Chronic Kidney Disease.    EGFR (Non-African Amer.)  Date Value Ref Range Status  07/20/2014 >60  Final    Comment:    eGFR values <39mL/min/1.73 m2 may be an indication of chronic kidney disease (CKD). Calculated eGFR is useful in patients with stable renal function. The eGFR calculation will not be reliable in acutely ill patients when serum creatinine is changing rapidly. It is not useful in patients on dialysis. The eGFR calculation may  not be applicable to patients at the low and high extremes of body sizes, pregnant women, and vegetarians.    GFR calc non Af Amer  Date Value Ref Range Status  01/29/2017 >60 >60 mL/min Final   eGFR  Date Value Ref Range Status  08/13/2023 81 >59 mL/min/1.73 Final         Passed - Patient is not pregnant      Passed - Last BP in normal range    BP Readings from Last 1 Encounters:  09/10/23 128/76         Passed - Valid encounter within last 6 months    Recent Outpatient Visits           1 month ago Essential hypertension   [place] [facility] at [facility], Vinay K, MD   2 months ago General medical exam   [facility] at [facility], Vinay K, MD   2 months ago Essential hypertension   [facility] at [facility], Vinay K, MD       Future Appointments  In 4 months Kotturi, Vinay K, MD [facility] at [facility]             atorvastatin  (LIPITOR) 40 MG tablet [Pharmacy Med Name: ATORVASTATIN  40 MG TABLET] 90 tablet 1    Sig: TAKE 1 TABLET BY MOUTH EVERY DAY     Cardiovascular:  Antilipid - Statins Failed - 10/26/2023  5:15 PM      Failed - Lipid Panel in normal range within the last 12 months    Cholesterol, Total  Date Value Ref Range Status  08/13/2023 129 100 - 199 mg/dL Final   LDL Chol Calc (NIH)  Date Value Ref Range Status  08/13/2023 71 0 - 99 mg/dL Final   HDL  Date Value Ref Range Status  08/13/2023 36 (L) >39 mg/dL Final   Triglycerides  Date Value Ref Range Status  08/13/2023 124 0 - 149 mg/dL Final         Passed - Patient is not pregnant      Passed - Valid encounter within last 12 months    Recent Outpatient Visits           1 month ago Essential hypertension   [place] [facility] at [facility] Kotturi, Vinay K, MD   2 months  ago General medical exam   [facility] at [facility], Vinay K, MD   2 months ago Essential hypertension   [facility] at [facility], Vinay K, MD       Future Appointments             In 4 months Kotturi, Vinay K, MD [facility] at [facility], [facility]

## 2023-11-12 ENCOUNTER — Telehealth: Payer: Self-pay

## 2023-11-12 ENCOUNTER — Other Ambulatory Visit: Payer: Self-pay

## 2023-11-12 DIAGNOSIS — Z1211 Encounter for screening for malignant neoplasm of colon: Secondary | ICD-10-CM

## 2023-11-12 MED ORDER — NA SULFATE-K SULFATE-MG SULF 17.5-3.13-1.6 GM/177ML PO SOLN: 1.0000 | Freq: Once | ORAL | 0 refills | Status: AC

## 2023-11-12 NOTE — Telephone Encounter (Signed)
 Gastroenterology Pre-Procedure Review  Request Date: 11/12 Requesting Physician: Dr. Marinda  PATIENT REVIEW QUESTIONS: The patient responded to the following health history questions as indicated:    1. Are you having any GI issues? no 2. Do you have a personal history of Polyps? no 3. Do you have a family history of Colon Cancer or Polyps? no 4. Diabetes Mellitus? no 5. Joint replacements in the past 12 months?no 6. Major health problems in the past 3 months?no 7. Any artificial heart valves, MVP, or defibrillator?no    MEDICATIONS & ALLERGIES:    Patient reports the following regarding taking any anticoagulation/antiplatelet therapy:   Plavix, Coumadin, Eliquis, Xarelto, Lovenox, Pradaxa, Brilinta, or Effient? no Aspirin? no  Patient confirms/reports the following medications:  Current Outpatient Medications  Medication Sig Dispense Refill   amLODipine  (NORVASC ) 10 MG tablet Take 1 tablet (10 mg total) by mouth daily. 90 tablet 1   atorvastatin  (LIPITOR) 40 MG tablet Take 1 tablet (40 mg total) by mouth daily. 90 tablet 1   lisinopril -hydrochlorothiazide  (ZESTORETIC ) 20-25 MG tablet Take 1 tablet by mouth daily. 90 tablet 1   No current facility-administered medications for this visit.    Patient confirms/reports the following allergies:  No Known Allergies  No orders of the defined types were placed in this encounter.   AUTHORIZATION INFORMATION Primary Insurance: 1D#: Group #:  Secondary Insurance: 1D#: Group #:  SCHEDULE INFORMATION: Date:02/13/24  Time: Location: [place]

## 2023-12-11 ENCOUNTER — Ambulatory Visit (INDEPENDENT_AMBULATORY_CARE_PROVIDER_SITE_OTHER)

## 2023-12-11 DIAGNOSIS — Z23 Encounter for immunization: Secondary | ICD-10-CM | POA: Diagnosis not present

## 2023-12-11 NOTE — Progress Notes (Signed)
 Patient is in office today for a nurse visit for Immunization. Patient Injection was given in the  Left deltoid. Patient tolerated injection well.

## 2024-02-07 ENCOUNTER — Other Ambulatory Visit: Payer: Self-pay | Admitting: Family Medicine

## 2024-02-08 NOTE — Telephone Encounter (Signed)
 Requested Prescriptions  Pending Prescriptions Disp Refills   amLODipine  (NORVASC ) 10 MG tablet [Pharmacy Med Name: AMLODIPINE  BESYLATE 10 MG TAB] 90 tablet 0    Sig: TAKE 1 TABLET BY MOUTH EVERY DAY     Cardiovascular: Calcium  Channel Blockers 2 Passed - 02/08/2024 12:33 PM      Passed - Last BP in normal range    BP Readings from Last 1 Encounters:  09/10/23 128/76         Passed - Last Heart Rate in normal range    Pulse Readings from Last 1 Encounters:  09/10/23 82         Passed - Valid encounter within last 6 months    Recent Outpatient Visits           5 months ago Essential hypertension   [place] [facility] at [facility] Kotturi, Vinay K, MD   5 months ago General medical exam   [facility] at [facility], Vinay K, MD   6 months ago Essential hypertension   [facility] at [facility], Vinay K, MD       Future Appointments             In 1 month Kotturi, Vinay K, MD [facility] Medicine at [facility], [phone] [place]

## 2024-02-13 ENCOUNTER — Other Ambulatory Visit: Payer: Self-pay

## 2024-02-13 ENCOUNTER — Encounter: Payer: Self-pay | Admitting: General Surgery

## 2024-02-13 ENCOUNTER — Encounter
Admission: RE | Disposition: A | Discharge status: Home / Self Care | Payer: Self-pay | Source: Home / Self Care | Attending: General Surgery

## 2024-02-13 ENCOUNTER — Ambulatory Visit: Admitting: Certified Registered"

## 2024-02-13 ENCOUNTER — Ambulatory Visit
Admission: RE | Admit: 2024-02-13 | Discharge: 2024-02-13 | Disposition: A | Discharge status: Home / Self Care | Attending: General Surgery | Admitting: General Surgery

## 2024-02-13 DIAGNOSIS — Z59868 Other specified financial insecurity: Secondary | ICD-10-CM | POA: Diagnosis not present

## 2024-02-13 DIAGNOSIS — Z1211 Encounter for screening for malignant neoplasm of colon: Secondary | ICD-10-CM | POA: Insufficient documentation

## 2024-02-13 DIAGNOSIS — K5790 Diverticulosis of intestine, part unspecified, without perforation or abscess without bleeding: Secondary | ICD-10-CM | POA: Diagnosis not present

## 2024-02-13 DIAGNOSIS — I10 Essential (primary) hypertension: Secondary | ICD-10-CM | POA: Diagnosis not present

## 2024-02-13 DIAGNOSIS — Z79899 Other long term (current) drug therapy: Secondary | ICD-10-CM | POA: Diagnosis not present

## 2024-02-13 DIAGNOSIS — K219 Gastro-esophageal reflux disease without esophagitis: Secondary | ICD-10-CM | POA: Insufficient documentation

## 2024-02-13 DIAGNOSIS — K5730 Diverticulosis of large intestine without perforation or abscess without bleeding: Secondary | ICD-10-CM | POA: Diagnosis not present

## 2024-02-13 DIAGNOSIS — E782 Mixed hyperlipidemia: Secondary | ICD-10-CM | POA: Diagnosis not present

## 2024-02-13 DIAGNOSIS — F17290 Nicotine dependence, other tobacco product, uncomplicated: Secondary | ICD-10-CM | POA: Insufficient documentation

## 2024-02-13 SURGERY — COLONOSCOPY
Anesthesia: General

## 2024-02-13 MED ORDER — LIDOCAINE HCL (CARDIAC) PF 100 MG/5ML IV SOSY
PREFILLED_SYRINGE | INTRAVENOUS | Status: DC | PRN
  Administered 2024-02-13: 100 mg via INTRAVENOUS

## 2024-02-13 MED ORDER — SODIUM CHLORIDE 0.9 % IV SOLN: INTRAVENOUS | Status: DC

## 2024-02-13 MED ORDER — PROPOFOL 500 MG/50ML IV EMUL
INTRAVENOUS | Status: DC | PRN
  Administered 2024-02-13: 120 ug/kg/min via INTRAVENOUS
  Administered 2024-02-13: 120 mg via INTRAVENOUS

## 2024-02-13 MED ORDER — LIDOCAINE HCL (PF) 2 % IJ SOLN
INTRAMUSCULAR | Status: AC
  Filled 2024-02-13: qty 5

## 2024-02-13 MED ORDER — PROPOFOL 10 MG/ML IV BOLUS
INTRAVENOUS | Status: AC
  Filled 2024-02-13: qty 40

## 2024-02-13 MED ORDER — PROPOFOL 10 MG/ML IV BOLUS
INTRAVENOUS | Status: AC
  Filled 2024-02-13: qty 20

## 2024-02-13 NOTE — Anesthesia Preprocedure Evaluation (Signed)
 Anesthesia Evaluation  Patient identified by MRN, date of birth, ID band Patient awake    Reviewed: Allergy & Precautions, NPO status , Patient's Chart, lab work & pertinent test results  Airway Mallampati: II  TM Distance: >3 FB Neck ROM: Full    Dental  (+) Teeth Intact   Pulmonary neg pulmonary ROS, Current Smoker and Patient abstained from smoking.   Pulmonary exam normal  + decreased breath sounds      Cardiovascular Exercise Tolerance: Good hypertension, Pt. on medications negative cardio ROS Normal cardiovascular exam Rhythm:Regular Rate:Normal     Neuro/Psych negative neurological ROS  negative psych ROS   GI/Hepatic negative GI ROS, Neg liver ROS,GERD  Medicated,,  Endo/Other  negative endocrine ROS  Class 3 obesity  Renal/GU negative Renal ROS  negative genitourinary   Musculoskeletal   Abdominal  (+) + obese  Peds negative pediatric ROS (+)  Hematology negative hematology ROS (+)   Anesthesia Other Findings Past Medical History: No date: GERD (gastroesophageal reflux disease) No date: Hypertension  No past surgical history on file.  BMI    Body Mass Index: 27.75 kg/m      Reproductive/Obstetrics negative OB ROS                              Anesthesia Physical Anesthesia Plan  ASA: 3  Anesthesia Plan: General   Post-op Pain Management:    Induction: Intravenous  PONV Risk Score and Plan: Propofol infusion and TIVA  Airway Management Planned: Natural Airway and Nasal Cannula  Additional Equipment:   Intra-op Plan:   Post-operative Plan:   Informed Consent: I have reviewed the patients History and Physical, chart, labs and discussed the procedure including the risks, benefits and alternatives for the proposed anesthesia with the patient or authorized representative who has indicated his/her understanding and acceptance.     Dental Advisory Given  Plan  Discussed with: CRNA  Anesthesia Plan Comments:         Anesthesia Quick Evaluation

## 2024-02-13 NOTE — Op Note (Signed)
 [facility] Gastroenterology Patient Name: Timothy Combs Procedure Date: 02/13/2024 9:15 AM MRN: [id number] Account #: [id number] Date of Birth: Nov 08, 1965 Admit Type: Outpatient Age: 58 Room: [facility] ENDO ROOM 1 Gender: Male Note Status: Finalized Instrument Name: Colon Scope [id number] Procedure:             Colonoscopy Indications:           Screening for colorectal malignant neoplasm Providers:             Jayson KIDD. Marinda, MD Referring MD:          Mackey Timothy Combs (Referring MD) Medicines:             See the Anesthesia note for documentation of the                         administered medications Complications:         No immediate complications. Estimated blood loss:                         Minimal. Procedure:             Pre-Anesthesia Assessment:                        - Prior to the procedure, a History and Physical was                         performed, and patient medications and allergies were                         reviewed. The patient's tolerance of previous                         anesthesia was also reviewed. The risks and benefits                         of the procedure and the sedation options and risks                         were discussed with the patient. All questions were                         answered, and informed consent was obtained. Prior                         Anticoagulants: The patient has taken no anticoagulant                         or antiplatelet agents. ASA Grade Assessment: II - A                         patient with mild systemic disease. After reviewing                         the risks and benefits, the patient was deemed in                         satisfactory condition to undergo the procedure.  After obtaining informed consent, the colonoscope was                         passed under direct vision. Throughout the procedure,                         the patient's blood pressure, pulse, and  oxygen                         saturations were monitored continuously. The                         Colonoscope was introduced through the anus and                         advanced to the the cecum, identified by appendiceal                         orifice and ileocecal valve. The colonoscopy was                         performed with ease. The patient tolerated the                         procedure well. The quality of the bowel preparation                         was excellent. Findings:      Multiple medium-mouthed diverticula were found in the sigmoid colon.       There was no evidence of diverticular bleeding. Impression:            - The entire examined colon is normal on direct and                         retroflexion views.                        - No specimens collected.                        - Moderate diverticulosis in the sigmoid colon. There                         was no evidence of diverticular bleeding. Recommendation:        - Discharge patient to home (ambulatory).                        - Resume previous diet.                        - Repeat colonoscopy in 10 years for screening                         purposes. Procedure Code(s):     --- Professional ---                        [id number], Colonoscopy, flexible; diagnostic, including  collection of specimen(s) by brushing or washing, when                         performed (separate procedure) CPT copyright 2022 American Medical Association. All rights reserved. The codes documented in this report are preliminary and upon coder review may  be revised to meet current compliance requirements. [name], MD 02/13/2024 9:51:08 AM Number of Addenda: 0 Note Initiated On: 02/13/2024 9:15 AM Scope Withdrawal Time: 0 hours 13 minutes 6 seconds  Total Procedure Duration: 0 hours 19 minutes 17 seconds  Estimated Blood Loss:  Estimated blood loss was minimal.      [facility]

## 2024-02-13 NOTE — Transfer of Care (Signed)
 Immediate Anesthesia Transfer of Care Note  Patient: Timothy Combs  Procedure(s) Performed: COLONOSCOPY  Patient Location: Endoscopy Unit  Anesthesia Type:General  Level of Consciousness: drowsy  Airway & Oxygen Therapy: Patient Spontanous Breathing  Post-op Assessment: Report given to RN and Post -op Vital signs reviewed and stable  Post vital signs: Reviewed and stable  Last Vitals:  Vitals Value Taken Time  BP 103/67 02/13/24 09:51  Temp 35.8 C 02/13/24 09:50  Pulse 59 02/13/24 09:51  Resp 18 02/13/24 09:51  SpO2 99 % 02/13/24 09:51  Vitals shown include unfiled device data.  Last Pain:  Vitals:   02/13/24 0950  TempSrc: Temporal  PainSc:          Complications: No notable events documented.

## 2024-02-13 NOTE — Anesthesia Postprocedure Evaluation (Signed)
 Anesthesia Post Note  Patient: Timothy Combs  Procedure(s) Performed: COLONOSCOPY  Patient location during evaluation: PACU Anesthesia Type: General Level of consciousness: awake Pain management: satisfactory to patient Vital Signs Assessment: post-procedure vital signs reviewed and stable Respiratory status: nonlabored ventilation Cardiovascular status: stable Anesthetic complications: no   No notable events documented.   Last Vitals:  Vitals:   02/13/24 1001 02/13/24 1013  BP: (!) 122/91 (!) 140/94  Pulse: (!) 57 (!) 53  Resp: 16 20  Temp:    SpO2: 99% 99%    Last Pain:  Vitals:   02/13/24 1013  TempSrc:   PainSc: 0-No pain                 VAN STAVEREN,Deretha Ertle

## 2024-02-13 NOTE — H&P (Signed)
 Primary Care Physician:  Kotturi, Vinay K, MD Primary Gastroenterologist:  Dr. Marinda  Pre-Procedure History & Physical: HPI:  Timothy Combs is a 58 y.o. male is here for an colonoscopy.   Past Medical History:  Diagnosis Date   GERD (gastroesophageal reflux disease)    Hypertension     No past surgical history on file.  Prior to Admission medications   Medication Sig Start Date End Date Taking? Authorizing Provider  amLODipine  (NORVASC ) 10 MG tablet TAKE 1 TABLET BY MOUTH EVERY DAY 02/08/24   Kotturi, Vinay K, MD  atorvastatin  (LIPITOR) 40 MG tablet Take 1 tablet (40 mg total) by mouth daily. 09/10/23   Kotturi, Vinay K, MD  lisinopril -hydrochlorothiazide  (ZESTORETIC ) 20-25 MG tablet Take 1 tablet by mouth daily. 09/10/23   Kotturi, Vinay K, MD    Allergies as of 11/13/2023   (No Known Allergies)    Family History  Problem Relation Age of Onset   Heart failure Mother    Hypertension Mother     Social History   Socioeconomic History   Marital status: Married    Spouse name: Not on file   Number of children: Not on file   Years of education: Not on file   Highest education level: 12th grade  Occupational History   Not on file  Tobacco Use   Smoking status: Some Days    Types: Cigars    Passive exposure: Never   Smokeless tobacco: Never  Vaping Use   Vaping status: Never Used  Substance and Sexual Activity   Alcohol use: Not Currently   Drug use: Not Currently   Sexual activity: Yes  Other Topics Concern   Not on file  Social History Narrative   Not on file   Social Drivers of Health   Financial Resource Strain: Medium Risk (08/13/2023)   Overall Financial Resource Strain (CARDIA)    Difficulty of Paying Living Expenses: Somewhat hard  Food Insecurity: Food Insecurity Present (08/13/2023)   Hunger Vital Sign    Worried About Running Out of Food in the Last Year: Sometimes true    Ran Out of Food in the Last Year: Patient declined  Transportation Needs: No  Transportation Needs (08/13/2023)   PRAPARE - Administrator, Civil Service (Medical): No    Lack of Transportation (Non-Medical): No  Physical Activity: Unknown (08/13/2023)   Exercise Vital Sign    Days of Exercise per Week: 0 days    Minutes of Exercise per Session: Not on file  Stress: No Stress Concern Present (08/13/2023)   [employer] of Occupational Health - Occupational Stress Questionnaire    Feeling of Stress : Not at all  Social Connections: Moderately Isolated (08/13/2023)   Social Connection and Isolation Panel    Frequency of Communication with Friends and Family: More than three times a week    Frequency of Social Gatherings with Friends and Family: Twice a week    Attends Religious Services: Never    Database Administrator or Organizations: No    Attends Engineer, Structural: Not on file    Marital Status: Married  Catering Manager Violence: Not At Risk (08/13/2023)   Humiliation, Afraid, Rape, and Kick questionnaire    Fear of Current or Ex-Partner: No    Emotionally Abused: No    Physically Abused: No    Sexually Abused: No    Review of Systems: See HPI, otherwise negative ROS  Physical Exam: BP (!) 146/103   Pulse 62  Temp 97.6 F (36.4 C) (Tympanic)   Resp 20   Ht 6' 3 (1.905 m)   Wt 100.7 kg   SpO2 99%   BMI 27.75 kg/m  General:   Alert,  pleasant and cooperative in NAD Head:  Normocephalic and atraumatic. Neck:  Supple; no masses or thyromegaly. Lungs:  Clear throughout to auscultation.    Heart:  Regular rate and rhythm. Abdomen:  Soft, nontender and nondistended. Normal bowel sounds, without guarding, and without rebound.   Neurologic:  Alert and  oriented x4;  grossly normal neurologically.  Impression/Plan: Timothy Combs is here for an colonoscopy to be performed for screening  Risks, benefits, limitations, and alternatives regarding  colonoscopy have been reviewed with the patient.  Questions have been  answered.  All parties agreeable.   Timothy MALVA Endow, MD  02/13/2024, 9:18 AM

## 2024-03-11 ENCOUNTER — Ambulatory Visit: Admitting: Family Medicine

## 2024-03-11 ENCOUNTER — Encounter: Payer: Self-pay | Admitting: Family Medicine

## 2024-03-11 VITALS — BP 130/88 | HR 60 | Ht 75.0 in | Wt 230.0 lb

## 2024-03-11 DIAGNOSIS — Z760 Encounter for issue of repeat prescription: Secondary | ICD-10-CM | POA: Diagnosis not present

## 2024-03-11 DIAGNOSIS — I10 Essential (primary) hypertension: Secondary | ICD-10-CM | POA: Diagnosis not present

## 2024-03-11 DIAGNOSIS — Z23 Encounter for immunization: Secondary | ICD-10-CM

## 2024-03-11 DIAGNOSIS — R7303 Prediabetes: Secondary | ICD-10-CM

## 2024-03-11 DIAGNOSIS — E782 Mixed hyperlipidemia: Secondary | ICD-10-CM

## 2024-03-11 MED ORDER — AMLODIPINE BESYLATE 10 MG PO TABS: 10.0000 mg | ORAL_TABLET | Freq: Every day | ORAL | 0 refills | Status: AC

## 2024-03-11 MED ORDER — LISINOPRIL-HYDROCHLOROTHIAZIDE 20-25 MG PO TABS: 1.0000 | ORAL_TABLET | Freq: Every day | ORAL | 1 refills | Status: AC

## 2024-03-11 MED ORDER — ATORVASTATIN CALCIUM 40 MG PO TABS: 40.0000 mg | ORAL_TABLET | Freq: Every day | ORAL | 1 refills | Status: AC

## 2024-03-11 NOTE — Progress Notes (Signed)
 Established Patient Office Visit  Patient ID: TREASURE INGRUM, male    DOB: [birth year]  Age: 58 y.o. MRN: [id number] PCP: Wynne Jury K, MD  Chief Complaint  Patient presents with   Hypertension    Patient presents today for a follow up on  HTN. He has been taking his medications as directed and needs refills on Lisinopril -HTZ.    Subjective:     HPI  Discussed the use of AI scribe software for clinical note transcription with the patient, who gave verbal consent to proceed.  History of Present Illness Timothy Combs is a 58 year old male with hypertension and borderline diabetes who presents for medication refills and follow-up.  He has not been regularly monitoring his blood pressure but still has a blood pressure cuff. He is not strictly adhering to a salt-restricted diet but tries to avoid fried foods, opting for baked and grilled options. Occasionally, he consumes canned foods but chooses low sodium options and rinses them before consumption. He reports no side effects from his current medications.  He has not smoked in five months and reports no cravings. Previously, he smoked one or two cigarettes a week, often buying singles rather than packs.  He declined the flu shot, stating that it makes him feel sick, although it is available for free at his workplace.  Pt agreed for tetanus vaccine.     Review of Systems  All other systems reviewed and are negative.     Objective:     BP 130/88   Pulse 60   Ht 6' 3 (1.905 m)   Wt 230 lb (104.3 kg)   SpO2 97%   BMI 28.75 kg/m  BP Readings from Last 3 Encounters:  03/11/24 130/88  02/13/24 (!) 140/94  09/10/23 128/76      Physical Exam Vitals and nursing note reviewed.  Constitutional:      Appearance: Normal appearance.  HENT:     Head: Normocephalic.     Right Ear: External ear normal.     Left Ear: External ear normal.  Eyes:     Conjunctiva/sclera: Conjunctivae normal.  Cardiovascular:      Rate and Rhythm: Normal rate.  Pulmonary:     Effort: Pulmonary effort is normal. No respiratory distress.  Abdominal:     Palpations: Abdomen is soft.  Musculoskeletal:        General: Normal range of motion.  Skin:    General: Skin is warm.  Neurological:     Mental Status: He is alert and oriented to person, place, and time.  Psychiatric:        Mood and Affect: Mood normal.     Physical Exam     No results found for any visits on 03/11/24.     The ASCVD Risk score (Arnett DK, et al., 2019) failed to calculate for the following reasons:   The valid total cholesterol range is 130 to 320 mg/dL    Assessment & Plan:   Problem List Items Addressed This Visit       Cardiovascular and Mediastinum   Essential hypertension - Primary   Relevant Medications   amLODipine  (NORVASC ) 10 MG tablet   lisinopril -hydrochlorothiazide  (ZESTORETIC ) 20-25 MG tablet   atorvastatin  (LIPITOR) 40 MG tablet     Other   Mixed hyperlipidemia   Relevant Medications   amLODipine  (NORVASC ) 10 MG tablet   lisinopril -hydrochlorothiazide  (ZESTORETIC ) 20-25 MG tablet   atorvastatin  (LIPITOR) 40 MG tablet   Other Visit Diagnoses  Medication refill       Relevant Medications   lisinopril -hydrochlorothiazide  (ZESTORETIC ) 20-25 MG tablet   atorvastatin  (LIPITOR) 40 MG tablet       Assessment and Plan Assessment & Plan Essential hypertension Hypertension managed with Lisinopril -hydrochlorothiazide . Blood pressure monitoring inconsistent. Dietary salt reduction efforts noted. - Refilled Lisinopril -hydrochlorothiazide  20-25 MG oral daily and Amlodipine  10 mg every day - Encouraged regular blood pressure monitoring. - Advised on dietary salt reduction.  Prediabetes Borderline diabetic status with emphasis on sugar intake reduction to prevent diabetes progression and improve cardiovascular and renal health. - Advised on reducing sugar intake, including avoiding sugary drinks and cutting  down on bread, pasta, and rice. - Encouraged regular exercise.  Nicotine dependence, in remission Abstinent from smoking for five months with no cravings. Plans to continue abstinence. - Encouraged continued abstinence from smoking. - Discussed availability of free nicotine replacement therapies such as gums and patches.  General Health Maintenance Declined flu shot due to past adverse reactions. Plans for tetanus vaccine in future. Labs due for next visit. - Will schedule labs for next visit. - Discussed tetanus vaccine for future consideration.    No follow-ups on file.    Vinary K Wilkins Elpers, MD [facility]

## 2024-09-09 ENCOUNTER — Encounter: Admitting: Family Medicine
